# Patient Record
Sex: Female | Born: 1960 | Race: White | Hispanic: No | Marital: Single | Smoking: Current every day smoker
Health system: Southern US, Community
[De-identification: ages and names within clinical notes are randomized; demographics above are authoritative.]

## PROBLEM LIST (undated history)

## (undated) DIAGNOSIS — L409 Psoriasis, unspecified: Secondary | ICD-10-CM

## (undated) DIAGNOSIS — Z720 Tobacco use: Secondary | ICD-10-CM

## (undated) DIAGNOSIS — F419 Anxiety disorder, unspecified: Secondary | ICD-10-CM

## (undated) DIAGNOSIS — G5600 Carpal tunnel syndrome, unspecified upper limb: Secondary | ICD-10-CM

## (undated) DIAGNOSIS — T7840XA Allergy, unspecified, initial encounter: Secondary | ICD-10-CM

## (undated) DIAGNOSIS — A879 Viral meningitis, unspecified: Secondary | ICD-10-CM

## (undated) HISTORY — DX: Anxiety disorder, unspecified: F41.9

## (undated) HISTORY — DX: Allergy, unspecified, initial encounter: T78.40XA

## (undated) HISTORY — DX: Tobacco use: Z72.0

## (undated) HISTORY — PX: OTHER SURGICAL HISTORY: SHX169

## (undated) HISTORY — DX: Carpal tunnel syndrome, unspecified upper limb: G56.00

## (undated) HISTORY — DX: Psoriasis, unspecified: L40.9

## (undated) HISTORY — DX: Viral meningitis, unspecified: A87.9

---

## 1997-10-17 ENCOUNTER — Other Ambulatory Visit: Admission: RE | Admit: 1997-10-17 | Discharge: 1997-10-17 | Payer: Self-pay | Admitting: *Deleted

## 1997-10-19 ENCOUNTER — Inpatient Hospital Stay (HOSPITAL_COMMUNITY): Admission: EM | Admit: 1997-10-19 | Discharge: 1997-10-24 | Payer: Self-pay | Admitting: Emergency Medicine

## 1998-01-31 LAB — HM DEXA SCAN

## 1998-11-03 ENCOUNTER — Other Ambulatory Visit: Admission: RE | Admit: 1998-11-03 | Discharge: 1998-11-03 | Payer: Self-pay | Admitting: *Deleted

## 1999-11-25 ENCOUNTER — Other Ambulatory Visit: Admission: RE | Admit: 1999-11-25 | Discharge: 1999-11-25 | Payer: Self-pay | Admitting: *Deleted

## 2002-11-26 ENCOUNTER — Other Ambulatory Visit: Admission: RE | Admit: 2002-11-26 | Discharge: 2002-11-26 | Payer: Self-pay | Admitting: Family Medicine

## 2002-11-26 ENCOUNTER — Encounter: Payer: Self-pay | Admitting: Family Medicine

## 2002-11-26 LAB — CONVERTED CEMR LAB: Pap Smear: NORMAL

## 2004-03-01 ENCOUNTER — Ambulatory Visit: Payer: Self-pay | Admitting: Family Medicine

## 2004-10-07 ENCOUNTER — Ambulatory Visit: Payer: Self-pay | Admitting: Family Medicine

## 2004-12-01 HISTORY — PX: ABDOMINAL HYSTERECTOMY: SHX81

## 2004-12-01 HISTORY — PX: US TRANSVAGINAL PELVIC MODIFIED: HXRAD721

## 2004-12-27 ENCOUNTER — Observation Stay (HOSPITAL_COMMUNITY): Admission: RE | Admit: 2004-12-27 | Discharge: 2004-12-28 | Payer: Self-pay | Admitting: Gynecology

## 2004-12-27 ENCOUNTER — Encounter (INDEPENDENT_AMBULATORY_CARE_PROVIDER_SITE_OTHER): Payer: Self-pay | Admitting: *Deleted

## 2005-03-15 ENCOUNTER — Ambulatory Visit: Payer: Self-pay | Admitting: Family Medicine

## 2006-06-27 ENCOUNTER — Ambulatory Visit: Payer: Self-pay | Admitting: Family Medicine

## 2006-07-24 ENCOUNTER — Ambulatory Visit: Payer: Self-pay | Admitting: Gynecology

## 2006-07-31 ENCOUNTER — Encounter: Admission: RE | Admit: 2006-07-31 | Discharge: 2006-07-31 | Payer: Self-pay | Admitting: Gynecology

## 2006-08-17 ENCOUNTER — Telehealth (INDEPENDENT_AMBULATORY_CARE_PROVIDER_SITE_OTHER): Payer: Self-pay | Admitting: *Deleted

## 2006-08-22 ENCOUNTER — Ambulatory Visit: Payer: Self-pay | Admitting: Internal Medicine

## 2006-09-13 ENCOUNTER — Ambulatory Visit: Payer: Self-pay | Admitting: Family Medicine

## 2006-09-13 DIAGNOSIS — F17200 Nicotine dependence, unspecified, uncomplicated: Secondary | ICD-10-CM | POA: Insufficient documentation

## 2006-11-21 ENCOUNTER — Encounter: Payer: Self-pay | Admitting: Family Medicine

## 2006-11-21 DIAGNOSIS — J45909 Unspecified asthma, uncomplicated: Secondary | ICD-10-CM | POA: Insufficient documentation

## 2006-11-21 DIAGNOSIS — L408 Other psoriasis: Secondary | ICD-10-CM | POA: Insufficient documentation

## 2006-11-21 DIAGNOSIS — J309 Allergic rhinitis, unspecified: Secondary | ICD-10-CM | POA: Insufficient documentation

## 2006-11-21 DIAGNOSIS — F411 Generalized anxiety disorder: Secondary | ICD-10-CM | POA: Insufficient documentation

## 2006-11-22 ENCOUNTER — Ambulatory Visit: Payer: Self-pay | Admitting: Family Medicine

## 2006-11-22 DIAGNOSIS — G5600 Carpal tunnel syndrome, unspecified upper limb: Secondary | ICD-10-CM | POA: Insufficient documentation

## 2006-12-19 ENCOUNTER — Ambulatory Visit: Payer: Self-pay | Admitting: Family Medicine

## 2008-02-25 ENCOUNTER — Ambulatory Visit: Payer: Self-pay | Admitting: Internal Medicine

## 2008-05-21 ENCOUNTER — Telehealth: Payer: Self-pay | Admitting: Family Medicine

## 2008-06-30 ENCOUNTER — Observation Stay: Payer: Self-pay | Admitting: Otolaryngology

## 2008-07-16 ENCOUNTER — Ambulatory Visit: Payer: Self-pay | Admitting: Obstetrics and Gynecology

## 2009-05-31 HISTORY — PX: CYSTECTOMY: SUR359

## 2009-08-31 LAB — CONVERTED CEMR LAB: Pap Smear: NORMAL

## 2009-08-31 LAB — HM MAMMOGRAPHY: HM Mammogram: NORMAL

## 2009-09-03 ENCOUNTER — Telehealth: Payer: Self-pay | Admitting: Family Medicine

## 2009-09-03 ENCOUNTER — Ambulatory Visit: Payer: Self-pay | Admitting: Family Medicine

## 2009-09-03 ENCOUNTER — Encounter: Payer: Self-pay | Admitting: Family Medicine

## 2009-09-03 LAB — CONVERTED CEMR LAB
Bilirubin Urine: NEGATIVE
Blood in Urine, dipstick: NEGATIVE
Glucose, Urine, Semiquant: NEGATIVE
Ketones, urine, test strip: NEGATIVE
Nitrite: NEGATIVE
Protein, U semiquant: NEGATIVE
Specific Gravity, Urine: 1.015
Urobilinogen, UA: 0.2
WBC Urine, dipstick: NEGATIVE
pH: 6

## 2009-09-04 ENCOUNTER — Ambulatory Visit (HOSPITAL_COMMUNITY): Admission: RE | Admit: 2009-09-04 | Discharge: 2009-09-04 | Payer: Self-pay | Admitting: Obstetrics and Gynecology

## 2009-09-07 ENCOUNTER — Ambulatory Visit: Payer: Self-pay | Admitting: Obstetrics and Gynecology

## 2009-09-08 ENCOUNTER — Encounter: Payer: Self-pay | Admitting: Family Medicine

## 2009-09-08 LAB — CONVERTED CEMR LAB
Trich, Wet Prep: NONE SEEN
Yeast Wet Prep HPF POC: NONE SEEN

## 2009-09-14 ENCOUNTER — Encounter: Admission: RE | Admit: 2009-09-14 | Discharge: 2009-09-14 | Payer: Self-pay | Admitting: Obstetrics and Gynecology

## 2009-10-29 ENCOUNTER — Telehealth (INDEPENDENT_AMBULATORY_CARE_PROVIDER_SITE_OTHER): Payer: Self-pay | Admitting: *Deleted

## 2009-11-02 ENCOUNTER — Ambulatory Visit: Payer: Self-pay | Admitting: Family Medicine

## 2009-11-02 LAB — CONVERTED CEMR LAB
ALT: 35 units/L (ref 0–35)
AST: 33 units/L (ref 0–37)
Albumin: 4.2 g/dL (ref 3.5–5.2)
Alkaline Phosphatase: 64 units/L (ref 39–117)
BUN: 9 mg/dL (ref 6–23)
Basophils Absolute: 0 10*3/uL (ref 0.0–0.1)
Basophils Relative: 0.6 % (ref 0.0–3.0)
Bilirubin, Direct: 0.1 mg/dL (ref 0.0–0.3)
CO2: 25 meq/L (ref 19–32)
Calcium: 8.9 mg/dL (ref 8.4–10.5)
Chloride: 104 meq/L (ref 96–112)
Cholesterol: 191 mg/dL (ref 0–200)
Creatinine, Ser: 0.7 mg/dL (ref 0.4–1.2)
Eosinophils Absolute: 0.1 10*3/uL (ref 0.0–0.7)
Eosinophils Relative: 1.6 % (ref 0.0–5.0)
GFR calc non Af Amer: 96.68 mL/min (ref >60)
Glucose, Bld: 94 mg/dL (ref 70–99)
HCT: 37.5 % (ref 36.0–46.0)
HDL: 48.4 mg/dL (ref >39.00)
Hemoglobin: 13.2 g/dL (ref 12.0–15.0)
LDL Cholesterol: 129 mg/dL — ABNORMAL HIGH (ref 0–99)
Lymphocytes Relative: 23.9 % (ref 12.0–46.0)
Lymphs Abs: 1.6 10*3/uL (ref 0.7–4.0)
MCHC: 35.1 g/dL (ref 30.0–36.0)
MCV: 94.7 fL (ref 78.0–100.0)
Monocytes Absolute: 0.6 10*3/uL (ref 0.1–1.0)
Monocytes Relative: 8.9 % (ref 3.0–12.0)
Neutro Abs: 4.4 10*3/uL (ref 1.4–7.7)
Neutrophils Relative %: 65 % (ref 43.0–77.0)
Platelets: 246 10*3/uL (ref 150.0–400.0)
Potassium: 4.1 meq/L (ref 3.5–5.1)
RBC: 3.96 M/uL (ref 3.87–5.11)
RDW: 13.2 % (ref 11.5–14.6)
Sodium: 138 meq/L (ref 135–145)
TSH: 1.63 microintl units/mL (ref 0.35–5.50)
Total Bilirubin: 0.5 mg/dL (ref 0.3–1.2)
Total CHOL/HDL Ratio: 4
Total Protein: 6.5 g/dL (ref 6.0–8.3)
Triglycerides: 70 mg/dL (ref 0.0–149.0)
VLDL: 14 mg/dL (ref 0.0–40.0)
WBC: 6.8 10*3/uL (ref 4.5–10.5)

## 2009-11-06 ENCOUNTER — Ambulatory Visit: Payer: Self-pay | Admitting: Family Medicine

## 2009-11-06 DIAGNOSIS — M25519 Pain in unspecified shoulder: Secondary | ICD-10-CM | POA: Insufficient documentation

## 2010-03-02 NOTE — Progress Notes (Signed)
Summary: CT Call Report  Phone Note From Other Clinic   Caller: [facility] - CT Call For: Dr. Para March Request: Call Patient Summary of Call: Call Report:   No acute appendicitis.  No evidence of bowel obstruction or ileus.  No findings of acute diverticulitis.  No hepatobiliary abnormality or UTI abnormality.  Likely cystic ovarian processes.   Call taken from [facility].  EMR went down before note could be originated.    Faxed report received.  Dr. Para March stated on the hard copy of the CT report to send in Tramadol 50 mg. by mouth every 6 hours as needed for pain  #30/1RF.  If pain continues, have patient call back to set up GYN U/S. Patient Advised.  Medication [facility] [place].  Patient states she has a GYN appt. on the 15th of August.  Delilah Shan CMA Duncan Dull)  September 03, 2009 3:35 PM   Follow-up for Phone Call        Thanks.  Please CC the CT result to gyn clinic.  She can follow up with them.   Follow-up by: Crawford Givens MD,  September 03, 2009 4:15 PM  Additional Follow-up for Phone Call Additional follow up Details #1::        Faxed a copy of the CT to OB/GYN [facility].  Lugene Fuquay CMA Duncan Dull)  September 03, 2009 4:47 PM

## 2010-03-02 NOTE — Assessment & Plan Note (Signed)
Summary: bad stomach pain/rbh   Vital Signs:  Patient profile:   50 year old female Height:      62 inches Weight:      126.25 pounds BMI:     23.17 Temp:     98.1 degrees F oral Pulse rate:   92 / minute Pulse rhythm:   regular BP sitting:   112 / 70  (left arm) Cuff size:   regular  Vitals Entered By: Delilah Shan CMA Duncan Dull) (September 03, 2009 10:36 AM) CC: Bad stomach pain since Monday, has been mostly at night until today.   History of Present Illness: Abd pain.  Started on Monday night.  Usually at night, but having pain now.  R and L lower quadrants initially, now more in RLQ.  "Like cramps but I've had a hysterectomy."  No vomiting but some nausea today.  No fevers.  Occ sweats this AM.  No BRBPR, some dysuria this AM (none before).  No vaginal discharge.  No cough.  Better laying down.  Dec in appetite.   Allergies: 1)  ! Sudafed (Pseudoephedrine Hcl) 2)  Sudafed   Impression & Recommendations:  Problem # 1:  ABDOMINAL PAIN RIGHT LOWER QUADRANT (ICD-789.03)  Orders: UA Dipstick w/o Micro (manual) (62694) Radiology Referral (Radiology)  Complete Medication List: 1)  Fexofenadine Hcl 180 Mg Tabs (Fexofenadine hcl) .Marland Kitchen.. 1 daily as needed for allergies 2)  Tramadol Hcl 50 Mg Tabs (Tramadol hcl) .... Take 1 tablet by mouth every 6 hours as needed for pain  Patient Instructions: 1)  See Shirlee Limerick about your referral before your leave today.   They should call me with the report of the scan.   Prescriptions: TRAMADOL HCL 50 MG TABS (TRAMADOL HCL) Take 1 tablet by mouth every 6 hours as needed for pain  #30 x 1   Entered by:   Delilah Shan CMA (AAMA)   Authorized by:   Crawford Givens MD   Signed by:   Delilah Shan CMA (AAMA) on 09/03/2009   Method used:   [facility]  [place] [id number]       [address]       [place], [place]  [address]       Ph: [phone] or [phone]       Fax: [phone]   RxID:    [id number]   Current Allergies (reviewed today): ! SUDAFED (PSEUDOEPHEDRINE HCL) SUDAFED  Laboratory Results   Urine Tests  Date/Time Received: September 03, 2009 11:19 AM   Routine Urinalysis   Color: yellow Appearance: Clear Glucose: negative   (Normal Range: Negative) Bilirubin: negative   (Normal Range: Negative) Ketone: negative   (Normal Range: Negative) Spec. Gravity: 1.015   (Normal Range: 1.003-1.035) Blood: negative   (Normal Range: Negative) pH: 6.0   (Normal Range: 5.0-8.0) Protein: negative   (Normal Range: Negative) Urobilinogen: 0.2   (Normal Range: 0-1) Nitrite: negative   (Normal Range: Negative) Leukocyte Esterace: negative   (Normal Range: Negative)       Appended Document: bad stomach pain/rbh    Clinical Lists Changes  Problems: Assessed ABDOMINAL PAIN RIGHT LOWER QUADRANT as comment only - D/w patient.  Pain migrated to RLQ.  Will send for CT to eval for appendicitis.  **Addendum.  Call report.  no appendicitis on scan.  Likely ovarian cysts.  Will have patient follow up with gyn.  See following notes. Observations: Added new observation of PEADULT: Crawford Givens MD ~General`Gen  appear (09/03/2009 22:07) Added new observation of GEN APPEAR: GEN: nad, alert and oriented, uncomfortable.  HEENT: mucous membranes moist NECK: supple w/o LA CV: rrr. PULM: ctab, no inc wob ABD: soft, +bs, RLQ tender to palpation but w/o rebound.  no cva pain EXT: no edema SKIN: no acute rash  (09/03/2009 22:07) Added new observation of ROS: See HPI.  Otherwise negative.   (09/03/2009 22:07)       Past History:  Past Medical History: Last updated: 11/21/2006 Allergic rhinitis Anxiety Asthma  Past Surgical History: Last updated: 11/21/2006 Viral meningitis ? Dexa- osteoporosis (2000) Arm fracture- bilateral Pelvic US- fibroids (12/2004) Hysterectomy- total (12/2004)   Review of Systems       See HPI.  Otherwise negative.     Physical  Exam  General:  GEN: nad, alert and oriented, uncomfortable.  HEENT: mucous membranes moist NECK: supple w/o LA CV: rrr. PULM: ctab, no inc wob ABD: soft, +bs, RLQ tender to palpation but w/o rebound.  no cva pain EXT: no edema SKIN: no acute rash    Impression & Recommendations:  Problem # 1:  ABDOMINAL PAIN RIGHT LOWER QUADRANT (ICD-789.03) D/w patient.  Pain migrated to RLQ.  Will send for CT to eval for appendicitis.  **Addendum.  Call report.  no appendicitis on scan.  Likely ovarian cysts.  Will have patient follow up with gyn.  See following notes.   Complete Medication List: 1)  Fexofenadine Hcl 180 Mg Tabs (Fexofenadine hcl) .Marland Kitchen.. 1 daily as needed for allergies 2)  Tramadol Hcl 50 Mg Tabs (Tramadol hcl) .... Take 1 tablet by mouth every 6 hours as needed for pain

## 2010-03-02 NOTE — Assessment & Plan Note (Signed)
Summary: cpx/alc   Vital Signs:  Patient profile:   50 year old female Height:      62 inches Weight:      129.25 pounds BMI:     23.73 Temp:     98.3 degrees F oral Pulse rate:   84 / minute Pulse rhythm:   regular BP sitting:   128 / 80  (left arm) Cuff size:   regular  Vitals Entered By: Lewanda Rife LPN (November 06, 2009 2:39 PM) CC: CPX GYN does pap and breast exam   History of Present Illness: here for health mt exam and to rev chronic med problems  mother passed away in may  copd and heart problems difficult transition -- was her caregiver  depression hits her at times family is good support  she does not want any counseling   fiancee is good support too   is more achey all over -- possibly from stress  started having some night sweats too    wt is up 3 lb with good bmi  128/80 blood pressure - good  partial t hyst in 06 pap per gyn -- in august  (also had a CT scan with low abd pain) - and was dx with a vaginal infection and she got antibiotic - that is better  mammogram also in aug - all was good   a year ago this past may had surgery with Dr Nelda Marseille (mass on roof of her mouth)   lipids this draw fair with trig 79 and HDL 48 and LDL 129  Td04 pneumovax 10/04 flu shot -- wants to do today   smoking status- still smokes much to stressed to think about quitting any time soon-- loss of family members has not helped much  L shoulder and arm hurts -- shoulder to elbow -- worse when she raises arm or lifts with that arm  has taken ibuprofen    wants to try something daily for anxiety   Allergies: 1)  ! Sudafed (Pseudoephedrine Hcl) 2)  Sudafed  Past History:  Family History: Last updated: 11/06/2009 Father: Lung cancer- smoker, deceased Mother: passed in 07/02/09 of copd  Siblings:   Social History: Last updated: 11/21/2006 Current Smoker Marital Status: Married Children: 1 daughter Occupation: Geologist, engineering  Risk Factors: Smoking Status:  current (08/22/2006)  Past Medical History: Allergic rhinitis Anxiety Asthma OP psoriasis carpal tunnel tabacco abuse  sees Dr Nelda Marseille Q 6 months for check of mouth after surgery     ENT Dr Nelda Marseille  gyn- Dr Okey Dupre  gyn -   Past Surgical History: Viral meningitis ? Dexa- osteoporosis (2000) Arm fracture- bilateral Pelvic US- fibroids (12/2004) Hysterectomy- partial  (12/2004) 2009-07-02-- had cyst removed from roof of mouth  Family History: Father: Lung cancer- smoker, deceased Mother: passed in 2009-07-02 of copd  Siblings:   Review of Systems General:  Complains of fatigue and loss of appetite; denies chills and fever. Eyes:  Denies blurring and eye irritation. CV:  Denies chest pain or discomfort, lightheadness, and palpitations. Resp:  Denies cough and wheezing. GI:  Denies abdominal pain, change in bowel habits, indigestion, nausea, and vomiting. GU:  Denies abnormal vaginal bleeding, discharge, dysuria, and urinary frequency. MS:  Complains of joint pain, muscle aches, and stiffness; denies joint redness, joint swelling, and cramps. Derm:  Denies itching, lesion(s), poor wound healing, and rash. Neuro:  Denies numbness and tingling. Psych:  Complains of anxiety, depression, and irritability; denies panic attacks, sense of great danger,  and suicidal thoughts/plans. Endo:  Denies cold intolerance, excessive thirst, excessive urination, and heat intolerance. Heme:  Denies abnormal bruising and bleeding.  Physical Exam  General:  Well-developed,well-nourished,in no acute distress; alert,appropriate and cooperative throughout examination seems very stressed and tired  Head:  normocephalic, atraumatic, and no abnormalities observed.   Eyes:  vision grossly intact, pupils equal, pupils round, and pupils reactive to light.  no conjunctival pallor, injection or icterus  Ears:  R ear normal and L ear normal.   Nose:  no nasal discharge.  - nares are boggy Mouth:  pharynx pink and  moist.   Neck:  supple with full rom and no masses or thyromegally, no JVD or carotid bruit  Chest Wall:  No deformities, masses, or tenderness noted. Lungs:  diffusely distant bs no rales or wheezes  nl exp phase Heart:  Normal rate and regular rhythm. S1 and S2 normal without gallop, murmur, click, rub or other extra sounds. Abdomen:  Bowel sounds positive,abdomen soft and non-tender without masses, organomegaly or hernias noted. no renal bruits  Msk:  No deformity or scoliosis noted of thoracic or lumbar spine.  poor rom L shoulder with some acromion tenderness Pulses:  R and L carotid,radial,femoral,dorsalis pedis and posterior tibial pulses are full and equal bilaterally Extremities:  No clubbing, cyanosis, edema, or deformity noted with normal full range of motion of all joints.   Neurologic:  sensation intact to light touch, gait normal, and DTRs symmetrical and normal.   Skin:  Intact without suspicious lesions or rashes Cervical Nodes:  No lymphadenopathy noted Inguinal Nodes:  No significant adenopathy Psych:  sad and tearful at times at others voices anger seems anxious and figity overall  fair eye contact    Impression & Recommendations:  Problem # 1:  HEALTH MAINTENANCE EXAM (ICD-V70.0) Assessment Comment Only reviewed health habits including diet, exercise and skin cancer prevention reviewed health maintenance list and family history  flu and pneumovax today  Problem # 2:  TOBACCO ABUSE (ICD-305.1) discussed in detail risks of smoking, and possible outcomes including COPD, vascular dz, cancer and also respiratory infections/sinus problems  pt states she is not ready or interested in quitting now - but voiced understanding of above   Problem # 3:  ANXIETY (ICD-300.00) Assessment: New with grief rxn  spent 25 minutes face to face time with pt , over 50% of which was spent on counseling and coordination of care  pt has unresolved anger and other family issues that need  to be resolved offered counseing/ declined trial of buspar with warnings  update  Her updated medication list for this problem includes:    Xanax 0.25 Mg Tabs (Alprazolam) .Marland Kitchen... As needed.    Buspirone Hcl 15 Mg Tabs (Buspirone hcl) .Marland Kitchen... 1/2 by mouth two times a day for anxiety  Problem # 4:  ALLERGIC RHINITIS (ICD-477.9) Assessment: Deteriorated allegra no longer works will try claritin or zyrtec otc and update The following medications were removed from the medication list:    Fexofenadine Hcl 180 Mg Tabs (Fexofenadine hcl) .Marland Kitchen... 1 daily as needed for allergies  Problem # 5:  SHOULDER PAIN, LEFT (ICD-719.41) Assessment: New L shoulder pain with features of tendonitis  will try ibuprofen as needed and update  consider appt with Dr Patsy Lager  Her updated medication list for this problem includes:    Tramadol Hcl 50 Mg Tabs (Tramadol hcl) .Marland Kitchen... Take 1 tablet by mouth every 6 hours as needed for pain    Ibuprofen 200 Mg Tabs (  Ibuprofen) ..... Otc as directed.  Complete Medication List: 1)  Tramadol Hcl 50 Mg Tabs (Tramadol hcl) .... Take 1 tablet by mouth every 6 hours as needed for pain 2)  Ibuprofen 200 Mg Tabs (Ibuprofen) .... Otc as directed. 3)  Xanax 0.25 Mg Tabs (Alprazolam) .... As needed. 4)  Buspirone Hcl 15 Mg Tabs (Buspirone hcl) .... 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, commercial) [id number]) Admin 1st Vaccine (34742) Flu Vaccine 69yrs + [phone])  Patient Instructions: 1)  you can raise your HDL (good cholesterol) by increasing exercise and eating omega 3 fatty acid supplement like fish oil or flax seed oil over the counter 2)  you can lower LDL (bad cholesterol) by limiting saturated fats in diet like red meat, fried foods, egg yolks, fatty breakfast meats, high fat dairy products and shellfish 3)  take ibuprofen for 2-4 weeks- if shoulder does not improve- call for appt with Dr  Patsy Lager  4)  stop the allegra and try plain claritin or zyrtec  5)  try the buspar for anxiety 6)  let me know if you are interested in counseling in the future  Prescriptions: BUSPIRONE HCL 15 MG TABS (BUSPIRONE HCL) 1/2 by mouth two times a day for anxiety  #30 x 11   Entered and Authorized by:   Judith Part MD   Signed by:   Judith Part MD on 11/06/2009   Method used:   [facility]  [place] [id number]       [address]       [place], [place]  [address]       Ph: [phone] or [phone]       Fax: [phone]   RxID:   [id number]   Current Allergies (reviewed today): ! SUDAFED (PSEUDOEPHEDRINE HCL) SUDAFED   Preventive Care Screening  Mammogram:    Date:  08/31/2009    Results:  normal   Pap Smear:    Date:  08/31/2009    Results:  normal     Pneumovax Vaccine    Vaccine Type: Pneumovax    Site: right deltoid    Mfr: Merck    Dose: 0.5 ml    Route: IM    Given by: Lewanda Rife LPN    Exp. Date: 04/15/2011    Lot #: [id number]    VIS given: 01/05/09 version given November 06, 2009.          Flu Vaccine Consent Questions     Do you have a history of severe allergic reactions to this vaccine? no    Any prior history of allergic reactions to egg and/or gelatin? no    Do you have a sensitivity to the preservative Thimersol? no    Do you have a past history of Guillan-Barre Syndrome? no    Do you currently have an acute febrile illness? no    Have you ever had a severe reaction to latex? no    Vaccine information given and explained to patient? yes    Are you currently pregnant? no    Lot Number:[id number]   Exp Date:07/31/2010   Site Given  Left Deltoid IMflu Lewanda Rife LPN  November 06, 2009 3:53 PM

## 2010-03-02 NOTE — Miscellaneous (Signed)
Summary: Controlled Substance Agreement  Controlled Substance Agreement   Imported By: Lanelle Bal 11/13/2009 14:16:54  _____________________________________________________________________  External Attachment:    Type:   Image     Comment:   External Document

## 2010-03-02 NOTE — Progress Notes (Signed)
----   Converted from flag ---- ---- 10/29/2009 2:54 PM, Judith Part MD wrote: please check wellness and lipid v70.0 thanks  ---- 10/29/2009 10:05 AM, Liane Comber CMA (AAMA) wrote: Lab orders please! Good Morning! This pt is scheduled for cpx labs Monday, which labs to draw and dx codes to use? Thanks Tasha ------------------------------

## 2010-06-15 NOTE — Assessment & Plan Note (Signed)
NAMEGLENNDA, Brewer                 ACCOUNT NO.:  [id number]   MEDICAL RECORD NO.:  [id number]          PATIENT TYPE:  POB   LOCATION:  [facility] at [facility]         FACILITY:  [facility]   PHYSICIAN:  Argentina Donovan, MD        DATE OF BIRTH:  07/04/1960   DATE OF SERVICE:  07/16/2008                                  CLINIC NOTE   The patient is a 50 year old Caucasian female who underwent total  vaginal hysterectomy in 2006 for benign disease, is here today for her  annual exam.  She has no significant complaints.  She is returning to  her ENT today as the biopsy of the roof of her mouth was done several  weeks ago for a mass there.  She is on Xanax rarely but p.r.n. for  anxiety, takes Allegra for seasonal allergies.   Significant change since she was last seen in the family history is her  sister had breast cancer and bilateral mastectomies.  No other family  history of breast cancer.   She is gravida 1, para 1-0-0-1, has one daughter age 33.  She has no  complaints at the present time.  We are going to schedule her for a  mammogram.  She has not had one in over 2 years.   PHYSICAL EXAMINATION:  Her blood pressure was 109/66, her pulse was 78.  Her height was 5 feet 1 inch and her weight 119 pounds.  She is a well-  developed, well-nourished white female no acute distress.  HEENT:  Within normal limits.  The thyroid is symmetrical with no  masses.  NECK:  Supple.  Back is erect.  LUNGS:  Clear to auscultation and percussion.  HEART:  No murmur, normal sinus rhythm.  BREASTS:  Symmetrical, no dominant masses.  No supraclavicular or  axillary nodes.  ABDOMEN:  Soft, flat, nontender.  No masses or organomegaly.  External  genitalia is normal.  BUS is within normal limits.  Vagina is clean,  well rugated, status hysterectomy.  Bimanual:  The adnexa is easily  palpable and within normal limits.  The cul-de-sac is free.  RECTAL EXAM:  No masses.   IMPRESSION:  Normal physical  examination.           ______________________________  Argentina Donovan, MD     PR/MEDQ  D:  07/16/2008  T:  07/16/2008  Job:  [id number]

## 2010-06-15 NOTE — Assessment & Plan Note (Signed)
NAMESHANDRIKA, AMBERS NO.:  [id number]   MEDICAL RECORD NO.:  [id number]          PATIENT TYPE:  POB   LOCATION:  [facility] at [facility]         FACILITY:  [facility]   PHYSICIAN:  Argentina Donovan, MD        DATE OF BIRTH:  [birth year]   DATE OF SERVICE:  09/07/2009                                  CLINIC NOTE   The patient is a 50 year old Caucasian female who underwent vaginal  hysterectomy in 2006 for benign disease in for annual examination.  She  still has her ovaries and within the last week as she was getting ready  for bed, she had sharp, stabbing pain that lasted about 10 minutes  across the lower portion of her abdomen and the following night the same  thing occurred and the following morning the pain occurred and it did  not go away, so she went to her family practice doctor who examined her  where she was very tender in the lower abdomen and sent her over for a  CAT scan.  They thought they might saw some abnormalities around the  ovaries, so they got an ultrasound which showed a normal ovaries and no  pathology seen in the pelvis.  There was no sign of any bowel pathology  on a CT scan or any other abnormality that could be seen.  The patient  denies any urinary frequency or nocturia.  The appendix was normal.  The  urine was normal at that time, although I am going to get a urine  culture.  On examination, we did a physical examination.  The abdominal  exam was benign as was the bimanual exam, so I am not sure what is  causing this.   PHYSICAL EXAMINATION:  VITAL SIGNS:  Her blood pressure today was  111/69, pulse 66, she weighs 127 pounds and 5 feet tall.  GENERAL:  Well-developed, well-nourished white female in no acute  distress.  HEENT:  Normal.  Thyroid is symmetrical.  No dominant masses.  NECK:  Supple.  BACK:  Erect.  LUNGS:  Clear to auscultation and percussion.  HEART:  No murmur.  Normal sinus rhythm.  BREASTS:  Symmetrical.  No dominant masses.   No supraclavicular or  axillary nodes.  ABDOMEN:  Soft, flat, nontender.  No masses.  No organomegaly.  External  genitalia is normal.  BUS within normal limits.  Vagina is clean, well  rugated with a cuff status hysterectomy.  Bimanual examination, I think  there is an ovary at the apex of the vagina.  However, the other ovary  could not be palpated and the cul-de-sac was free.  RECTAL:  Confirmatory.   Normal physical examination with pain of unknown etiology that is  intermittent and very sharp.  The only thing other than that what I  mentioned is that the patient's sister, 80 months old, has had bilateral  mastectomies.  This patient has always had a normal breast exam and is  scheduled for a mammogram in the near future.   IMPRESSION:  Normal examination, lower abdominal pain of unknown  etiology.          ______________________________  Argentina Donovan, MD    PR/MEDQ  D:  09/07/2009  T:  09/08/2009  Job:  [id number]

## 2010-06-18 NOTE — Discharge Summary (Signed)
[name], [name]                 ACCOUNT NO.:  [id number]   MEDICAL RECORD NO.:  [id number]          PATIENT TYPE:  OBV   LOCATION:  9319                          FACILITY:  [facility]   PHYSICIAN:  Ginger Carne, MD  DATE OF BIRTH:  02-04-1960   DATE OF ADMISSION:  12/27/2004  DATE OF DISCHARGE:  12/28/2004                                 DISCHARGE SUMMARY   REASON FOR ADMISSION:  Persistent vaginal bleeding, menometrorrhagia.   IN-HOSPITAL PROCEDURES:  Total vaginal hysterectomy.   FINAL DIAGNOSIS:  Total vaginal hysterectomy.   HISTORY OF PRESENT ILLNESS:  This patient is a 49 year old Caucasian female  who underwent the aforementioned procedure on December 27, 2004. The patient  had appropriate workup for menometrorrhagia prior to her surgery. The  patient's intraoperative course was uneventful. Postoperatively she was  afebrile, voided well and she had no vaginal bleeding. Calves without  tenderness, lungs were clear. H&H was 33.4 and 11.7. The patient was  discharged routine postoperative instructions including contacting the  office for temperature elevation above 100.4 degrees Fahrenheit, increasing  abdominal pain, increased vaginal bleeding, genitourinary or  gastrointestinal complaints. She was prescribed Percocet 5/325 1-2 every 4-6  hours, return to the office in the first week of January for postoperative  care. The patient plans to return to work within the next 7-10 days. All  questions answered to satisfaction of said patient and also instructions  understood.      Ginger Carne, MD  Electronically Signed     SHB/MEDQ  D:  12/28/2004  T:  12/28/2004  Job:  [id number]

## 2010-06-18 NOTE — Op Note (Signed)
NAMEETHELREDA, SUKHU                 ACCOUNT NO.:  [id number]   MEDICAL RECORD NO.:  [id number]          PATIENT TYPE:  OBV   LOCATION:  9319                          FACILITY:  [facility]   PHYSICIAN:  Ginger Carne, MD  DATE OF BIRTH:  [birth year]   DATE OF PROCEDURE:  12/27/2004  DATE OF DISCHARGE:                                 OPERATIVE REPORT   PREOPERATIVE DIAGNOSIS:  Abnormal uterine bleeding.   POSTOPERATIVE DIAGNOSIS:  Abnormal uterine bleeding.   PROCEDURE:  Total vaginal hysterectomy with preservation of right and left  tubes and ovaries.   SURGEON:  Ginger Carne, M.D.   ASSISTANT:  None.   COMPLICATIONS:  None immediate.   ESTIMATED BLOOD LOSS:  Less than 75 mL.   SPECIMEN:  Uterus and cervix.   ANESTHESIA:  MAC with paracervical Marcaine block.   OPERATIVE FINDINGS:  External genitalia, vulva, and vagina normal. Cervix  smooth without erosions or lesions. The uterus was about 6 weeks in size,  globular, but otherwise unremarkable. Both tubes and ovaries were normal.   OPERATIVE PROCEDURE:  The patient was prepped and draped in usual fashion,  placed in the lithotomy position and Betadine solution used for antiseptic  and the patient catheterized prior to the procedure. After adequate MAC  analgesia double toothed tenaculum placed on the anterior and posterior lips  of the cervix. Paracervical Marcaine with epinephrine block utilized. 2 cm  of anterior and posterior vaginal epithelium incised transversely. The  peritoneal reflections opened anteriorly and posteriorly without injury to  the respective organs. Uterosacral cardinal ligament complexes clamped, cut  and ligated with 0 Vicryl suture. This extended to the uterine vasculature  with its ascending branches. The utero-ovarian ligaments were bilaterally  clamped, cut and ligated with 0 Vicryl suture, transfixation method twice.  Bleeding points hemostatically checked. Blood clots removed. Closure of the  cuff in one layer of 0 Vicryl running interlocking suture. The patient  tolerated procedure well, returned to post anesthesia recovery room in  excellent condition.      Ginger Carne, MD  Electronically Signed    SHB/MEDQ  D:  12/27/2004  T:  12/27/2004  Job:  [id number]

## 2010-06-18 NOTE — H&P (Signed)
[name], [name]                 ACCOUNT NO.:  [id number]   MEDICAL RECORD NO.:  [id number]           PATIENT TYPE:   LOCATION:                                 FACILITY:   PHYSICIAN:  Ginger Carne, MD  DATE OF BIRTH:  06-30-1960   DATE OF ADMISSION:  DATE OF DISCHARGE:                                HISTORY & PHYSICAL   REASON FOR HOSPITALIZATION:  Persistent vaginal bleeding and leiomyomatous  uterus.   PROPOSED SURGICAL PROCEDURE:  Total vaginal hysterectomy.   HISTORY OF PRESENT ILLNESS:  This patient is a 51 year old gravida 1, para 1-  0-0-1, Caucasian female referred by Dr. Roxy Manns because of persistent  vaginal bleeding.  The patient had a Depo-Provera injection at the beginning  of September of 2006 which has resulted in persistent irregular spotting and  heavy bleeding.  Prior to this time she had not used any form of  contraception.  About four years ago she had a 2-year course of Depo-Provera  which resulted in amenorrhea.  She had stopped using said medication in the  following 2-year period.  The patient has no endocrinological sources for  her bleeding.  She takes no medications to enhance her bleeding propensity  and has no personal family history of bleeding diaphyses.  Endometrial  biopsy reveals decidualized endometrium.   Pelvic sonogram demonstrated a normal sized uterus with two leiomyoma in the  myometrium measuring under 2.5 cm.  Both ovaries were reported to be normal.  SIS demonstrates no intracavitary lesions.   OB-GYN HISTORY:  The patient in 1989 had a vaginal delivery, full term.   MEDICATIONS:  Allegra as needed, Flonase.   ALLERGIES:  SUDAFED.   PAST MEDICAL HISTORY:  Seasonal allergies.   PAST SURGICAL HISTORY:  None.   SOCIAL HISTORY:  The patient smokes two packs of cigarettes daily.  She  denies alcohol or drug abuse.   FAMILY HISTORY:  First degree relatives with myocardial infarction, strokes,  and hypertension.   REVIEW  OF SYSTEMS:  Negative.   PHYSICAL EXAMINATION:  VITAL SIGNS:  Blood pressure is 115/75, weight 111  pounds, height 5 feet 0 inches.  HEENT:  Grossly normal.  BREASTS:  Without masses, discharge, thickenings, or tenderness.  CHEST:  Clear to percussion and auscultation.  CARDIOVASCULAR:  Without murmurs or enlargements.  Regular rate and rhythm.  EXTREMITIES/LYMPHATICS/SKIN/NEUROLOGIC/  MUSCULOSKELETAL:  Systems normal.  ABDOMEN:  Soft without gross hepatosplenomegaly.  PELVIC:  Normal Pap smear.  External genitalia, vulva, and vagina normal.  Cervix smooth without erosions or lesions.  Uterus is eight weeks in size,  globular.  Both adnexa palpable, thought to be normal.  RECTAL:  Exam is Hemoccult negative without mass.   LABORATORY DATA:  Endometrial biopsy is negative for neoplasia or  hyperplasia.  The patient's hemoglobin was 14.7, hematocrit 42.1, from  December 15, 2004.   IMPRESSION:  1.  Persistent vaginal bleeding.  2.  Leiomyomatous uterus.  3.  Hormonal effects of Depo-Provera.   PLAN:  The patient is over 35 and a two pack a day smoker and is  contraindicated to utilize estrogen for control of bleeding.  This in  conjunction with progesterone, would effectively be birth control pills  which would raise her risk of hemorrhagic stroke.  The patient options  included to allow the medication to run its course with abatement of  bleeding.  Second was an endometrial ablation with laparoscopic bilateral  tubal ligation, and the third option was to perform a simple total vaginal  hysterectomy with preservation of both tubes and ovaries.  The patient is  desirous of birth control and after weighing all aforementioned options, has  agreed to a total vaginal hysterectomy,  The nature of said procedure was  discussed in detail.  Risks including injuries to ureter, bowel, and  bladder, possible conversion to a laparoscopic or open procedure, hemorrhage  requiring a blood  transfusion, infection, and postoperative pulmonary  complications discussed with the patient.  She was advised to try and cut  back on smoking.      Ginger Carne, MD  Electronically Signed     SHB/MEDQ  D:  12/20/2004  T:  12/20/2004  Job:  [id number]

## 2010-09-27 ENCOUNTER — Encounter: Payer: Self-pay | Admitting: Family Medicine

## 2010-09-27 ENCOUNTER — Ambulatory Visit (INDEPENDENT_AMBULATORY_CARE_PROVIDER_SITE_OTHER): Payer: BC Managed Care – PPO | Admitting: Family Medicine

## 2010-09-27 VITALS — BP 122/72 | HR 74 | Temp 98.2°F | Wt 135.0 lb

## 2010-09-27 DIAGNOSIS — M79609 Pain in unspecified limb: Secondary | ICD-10-CM

## 2010-09-27 DIAGNOSIS — M79673 Pain in unspecified foot: Secondary | ICD-10-CM

## 2010-09-27 NOTE — Progress Notes (Signed)
Friday evening "I could barely walk."  Going on for 2 months.  Pain in R foot.  Posterior pain, near the achilles.  Psoriasis on the foot, h/o. No trauma. Occ midfoot pain.  Pain on the bottom of heel.  It hurts worse later in the day than in the AM.   Usually wears flip flops.  Using ankle sleeve with minimal help.    Meds, vitals, and allergies reviewed.   ROS: See HPI.  Otherwise, noncontributory.  nad R ankle with normal inspection, not puffy, no bruising Distally nv intact Psoriatic changes noted Loss of arches in R foot noted ttp R medial ankle, inferior to med mal Achilles not ttp Plantar aspect not ttp Not ttp on 5th MT

## 2010-09-27 NOTE — Patient Instructions (Signed)
Take the ibuprofen with food, get soft inserts with arch support for your tennis shoes, and this gradually get better.  If you aren't getting better, Dr. Patsy Lager may be able to help.

## 2010-09-28 NOTE — Assessment & Plan Note (Signed)
Likely related to arch breakdown with change in pressure distribution/compensation.  D/w pt about wearing shoes, not flip flops, using inserts with arch support and the f/u prn.  She agrees. Ibuprofen with NSAID caution, then call back for eval with Dr. Patsy Lager if not improved.

## 2010-11-18 ENCOUNTER — Encounter: Payer: Self-pay | Admitting: Family Medicine

## 2010-11-19 ENCOUNTER — Ambulatory Visit (INDEPENDENT_AMBULATORY_CARE_PROVIDER_SITE_OTHER)
Admission: RE | Admit: 2010-11-19 | Discharge: 2010-11-19 | Disposition: A | Discharge status: Home / Self Care | Payer: BC Managed Care – PPO | Source: Ambulatory Visit | Attending: Family Medicine | Admitting: Family Medicine

## 2010-11-19 ENCOUNTER — Ambulatory Visit
Admission: RE | Admit: 2010-11-19 | Discharge: 2010-11-19 | Disposition: A | Discharge status: Home / Self Care | Payer: BC Managed Care – PPO | Source: Ambulatory Visit | Attending: Family Medicine | Admitting: Family Medicine

## 2010-11-19 ENCOUNTER — Ambulatory Visit (INDEPENDENT_AMBULATORY_CARE_PROVIDER_SITE_OTHER): Payer: BC Managed Care – PPO | Admitting: Family Medicine

## 2010-11-19 ENCOUNTER — Encounter: Payer: Self-pay | Admitting: Family Medicine

## 2010-11-19 VITALS — BP 112/66 | HR 80 | Temp 97.9°F | Ht 62.0 in | Wt 135.8 lb

## 2010-11-19 DIAGNOSIS — M79673 Pain in unspecified foot: Secondary | ICD-10-CM

## 2010-11-19 DIAGNOSIS — M79609 Pain in unspecified limb: Secondary | ICD-10-CM

## 2010-11-19 DIAGNOSIS — L408 Other psoriasis: Secondary | ICD-10-CM

## 2010-11-19 MED ORDER — MELOXICAM 15 MG PO TABS: 15.0000 mg | ORAL_TABLET | Freq: Every day | ORAL | Status: DC

## 2010-11-19 NOTE — Patient Instructions (Addendum)
xrays today Stop over the counter pain medicines and take mobic instead with food daily as needed  Ice if you can  Always wear shoes  Will make plan based on results

## 2010-11-19 NOTE — Progress Notes (Signed)
Subjective:    Patient ID: [name], female    DOB: 1960/08/02, 50 y.o.   MRN: [id number]  HPI Here for foot pain  Saw Dr Para March for this in august and thought to have some arch problems from wearing flip flops  Adv better shoe wear  Also has psoriasis   Went and got arch supports - at [facility]  Wearing in her shoes  - no more flip flops  So far no help at all   Was originally R foot  Now L foot started this week - when it popped -- sitting at a table  Also hurting from the compensating for the other foot   occ gets a little groin pain on L   R foot- pain is in the heel primarily  Sharp and dull  Is worst when sitting and then has to get up on her feet - if she keeps moving - still hurts but improves enough to get around  Tried heat (too cold natured to use ice)    L foot - pain is across the top  Sharp and dull  Has to untie shoe -hurts top of the foot  Hurts whenever she walks on it   Neither pains keep her up at night   No swelling or bruising   Patient Active Problem List  Diagnoses  . ANXIETY  . TOBACCO ABUSE  . CARPAL TUNNEL SYNDROME  . ALLERGIC RHINITIS  . ASTHMA  . PSORIASIS, PUSTULAR  . SHOULDER PAIN, LEFT  . Foot pain   Past Medical History  Diagnosis Date  . Allergy   . Anxiety   . Asthma   . Osteoporosis   . Psoriasis   . Carpal tunnel syndrome   . Tobacco abuse   . Viral meningitis    Past Surgical History  Procedure Date  . Abdominal hysterectomy 12/2004    partial  . Cystectomy May 2011    Roof of mouth  . US transvaginal pelvic modified 12/2004    Fibroids  . Arm fracture     Bilateral   History  Substance Use Topics  . Smoking status: Current Everyday Smoker  . Smokeless tobacco: Not on file  . Alcohol Use: Not on file   Family History  Problem Relation Age of Onset  . COPD Mother   . Cancer Father     Lung CA, smoker   Allergies  Allergen Reactions  . Pseudoephedrine     REACTION: Heart races   Current Outpatient  Prescriptions on File Prior to Visit  Medication Sig Dispense Refill  . busPIRone (BUSPAR) 15 MG tablet Take 1/2 tablet two times a day for anxiety as needed.      . Multiple Vitamin (MULTIVITAMIN) tablet Take 1 tablet by mouth daily.        Marland Kitchen co-enzyme Q-10 30 MG capsule Take 30 mg by mouth daily.        Marland Kitchen ibuprofen (ADVIL,MOTRIN) 200 MG tablet Take 200 mg by mouth every 6 (six) hours as needed.                Review of Systems Review of Systems  Constitutional: Negative for fever, appetite change, fatigue and unexpected weight change.  Eyes: Negative for pain and visual disturbance.  Respiratory: Negative for cough and shortness of breath.   Cardiovascular: Negative for cp or palpitations    Gastrointestinal: Negative for nausea, diarrhea and constipation.  Genitourinary: Negative for urgency and frequency.  Skin: Negative for pallor or rash  MSK pos for foot pain without joint swelling or redness Neurological: Negative for weakness, light-headedness, numbness and headaches.  Hematological: Negative for adenopathy. Does not bruise/bleed easily.  Psychiatric/Behavioral: Negative for dysphoric mood. The patient is not nervous/anxious.          Objective:   Physical Exam  Constitutional: She appears well-developed and well-nourished. No distress.  HENT:  Head: Normocephalic and atraumatic.  Eyes: Conjunctivae and EOM are normal. Pupils are equal, round, and reactive to light.  Neck: Normal range of motion. Neck supple.  Cardiovascular: Normal rate, regular rhythm, normal heart sounds and intact distal pulses.   Pulmonary/Chest: Effort normal and breath sounds normal. No respiratory distress. She has no wheezes.  Musculoskeletal: She exhibits tenderness. She exhibits no edema.       R foot is diffusely tender over calcaneous and arch  Nl rom toes  No other bony tenderness  No edema in either foot Some psoriasis plaques on both plantar surfaces  L foot- tender over lateral  2 metacarpals  No plantar tenderness Nl rom toes   Gait is labored overall and favors L foot   Both feet warm and well perfused  Neurological: She is alert. She has normal strength and normal reflexes. No cranial nerve deficit or sensory deficit. She exhibits normal muscle tone. Coordination normal.  Skin: Skin is warm and dry. Rash noted. No erythema. No pallor.  Psychiatric: She has a normal mood and affect.          Assessment & Plan:

## 2010-11-22 ENCOUNTER — Telehealth: Payer: Self-pay | Admitting: *Deleted

## 2010-11-22 DIAGNOSIS — M79673 Pain in unspecified foot: Secondary | ICD-10-CM

## 2010-11-22 NOTE — Telephone Encounter (Signed)
Patient notified as instructed by telephone.Pt is agreeable to see podiatrist. Pt will wait to hear from pt care coordinator and can be reached [phone].

## 2010-11-22 NOTE — Telephone Encounter (Signed)
Left foot normal , R foot small heel spur (common with plantar fasciitis)  I would like to refer her to podiatry for further eval - let me know if aggreable

## 2010-11-22 NOTE — Telephone Encounter (Signed)
Here is order

## 2010-11-22 NOTE — Telephone Encounter (Signed)
Pt is calling for her x-ray results from last week.  Please review and advise.

## 2010-11-22 NOTE — Assessment & Plan Note (Signed)
Some on bottom of each foot  Need to r/o pos of related arthritis xrays today

## 2010-11-22 NOTE — Assessment & Plan Note (Signed)
Bilateral  Suspect plantar fasciitis in R foot and strain in L from favoring it (stress fx unlikely but needs to be ruled out) Info sheet on plantar fasciitis given  Enc ice / stretches and hard soled shoe In light of hx of psoriasis on feet- also poss of psoriatic arthritis is there.... Check xrays and adv further

## 2011-02-22 ENCOUNTER — Other Ambulatory Visit: Payer: Self-pay | Admitting: *Deleted

## 2011-02-22 MED ORDER — MELOXICAM 15 MG PO TABS: 15.0000 mg | ORAL_TABLET | Freq: Every day | ORAL | Status: DC

## 2011-02-22 NOTE — Telephone Encounter (Signed)
Will refill electronically  

## 2011-07-04 ENCOUNTER — Other Ambulatory Visit: Payer: Self-pay | Admitting: Family Medicine

## 2011-07-04 ENCOUNTER — Ambulatory Visit (INDEPENDENT_AMBULATORY_CARE_PROVIDER_SITE_OTHER): Payer: BC Managed Care – PPO | Admitting: Family Medicine

## 2011-07-04 ENCOUNTER — Ambulatory Visit: Payer: BC Managed Care – PPO

## 2011-07-04 ENCOUNTER — Encounter: Payer: Self-pay | Admitting: Family Medicine

## 2011-07-04 VITALS — BP 100/70 | HR 76 | Temp 97.8°F | Wt 139.2 lb

## 2011-07-04 DIAGNOSIS — Z1231 Encounter for screening mammogram for malignant neoplasm of breast: Secondary | ICD-10-CM

## 2011-07-04 DIAGNOSIS — S30860A Insect bite (nonvenomous) of lower back and pelvis, initial encounter: Secondary | ICD-10-CM

## 2011-07-04 DIAGNOSIS — Z803 Family history of malignant neoplasm of breast: Secondary | ICD-10-CM

## 2011-07-04 DIAGNOSIS — T148XXA Other injury of unspecified body region, initial encounter: Secondary | ICD-10-CM

## 2011-07-04 MED ORDER — BUSPIRONE HCL 15 MG PO TABS: 7.5000 mg | ORAL_TABLET | Freq: Two times a day (BID) | ORAL | Status: DC | PRN

## 2011-07-04 MED ORDER — ALPRAZOLAM 0.5 MG PO TABS: 0.2500 mg | ORAL_TABLET | Freq: Every evening | ORAL | Status: DC | PRN

## 2011-07-04 NOTE — Progress Notes (Signed)
Subjective:    Patient ID: [name], female    DOB: May 11, 1960, 51 y.o.   MRN: [id number]  HPI Found a tick this am -- under bra-  Daughter got most of it out  Put band aid on it   Thinks she got it working outdoors on Saturday  Was rather large- had ingested blood   Is feeling ok  No fever  No rash or head ache or st  No redness around the bite  Patient Active Problem List  Diagnoses  . ANXIETY  . TOBACCO ABUSE  . CARPAL TUNNEL SYNDROME  . ALLERGIC RHINITIS  . ASTHMA  . PSORIASIS, PUSTULAR  . SHOULDER PAIN, LEFT  . Foot pain   Past Medical History  Diagnosis Date  . Allergy   . Anxiety   . Asthma   . Osteoporosis   . Psoriasis   . Carpal tunnel syndrome   . Tobacco abuse   . Viral meningitis    Past Surgical History  Procedure Date  . Abdominal hysterectomy 12/2004    partial  . Cystectomy May 2011    Roof of mouth  . US transvaginal pelvic modified 12/2004    Fibroids  . Arm fracture     Bilateral   History  Substance Use Topics  . Smoking status: Current Everyday Smoker  . Smokeless tobacco: Never Used  . Alcohol Use: Yes     rarely   Family History  Problem Relation Age of Onset  . COPD Mother   . Cancer Father     Lung CA, smoker   Allergies  Allergen Reactions  . Pseudoephedrine     REACTION: Heart races   Current Outpatient Prescriptions on File Prior to Visit  Medication Sig Dispense Refill  . acetaminophen (TYLENOL) 500 MG tablet Take 500 mg by mouth every 6 (six) hours as needed.        . ALPRAZolam (XANAX) 0.5 MG tablet Take 0.25-0.5 mg by mouth at bedtime as needed.        . busPIRone (BUSPAR) 15 MG tablet Take 1/2 tablet two times a day for anxiety as needed.      . cetirizine (ZYRTEC) 10 MG tablet Take 10 mg by mouth daily.        Marland Kitchen ibuprofen (ADVIL,MOTRIN) 200 MG tablet Take 200 mg by mouth every 6 (six) hours as needed.        . meloxicam (MOBIC) 15 MG tablet Take 1 tablet (15 mg total) by mouth daily. With food  30 tablet   5  . Multiple Vitamin (MULTIVITAMIN) tablet Take 1 tablet by mouth daily.        . mupirocin (BACTROBAN) 2 % ointment Apply 1 application topically daily.            Review of Systems Review of Systems  Constitutional: Negative for fever, appetite change, fatigue and unexpected weight change.  Eyes: Negative for pain and visual disturbance.  Respiratory: Negative for cough and shortness of breath.   Cardiovascular: Negative for cp or palpitations    Gastrointestinal: Negative for nausea, diarrhea and constipation.  Genitourinary: Negative for urgency and frequency.  Skin: Negative for pallor or rash   Neurological: Negative for weakness, light-headedness, numbness and headaches.  Hematological: Negative for adenopathy. Does not bruise/bleed easily.  Psychiatric/Behavioral: Negative for dysphoric mood. The patient is not nervous/anxious.          Objective:   Physical Exam  Constitutional: She appears well-developed and well-nourished. No distress.  HENT:  Head: Normocephalic and atraumatic.  Mouth/Throat: Oropharynx is clear and moist.  Eyes: Conjunctivae and EOM are normal. Pupils are equal, round, and reactive to light.  Neck: Normal range of motion.  Cardiovascular: Normal rate and regular rhythm.   Pulmonary/Chest: Effort normal and breath sounds normal.  Musculoskeletal: She exhibits no edema.  Lymphadenopathy:    She has no cervical adenopathy.  Neurological: She is alert.  Skin: Skin is warm and dry. No rash noted. There is erythema. No pallor.       Tick embedded in skin R torso- head remains with small amt of erythema (no bulls eye pattern) Head of tick removed in total with sterile tweezers and area cleaned and dressed with abx ointment   Psychiatric: She has a normal mood and affect.          Assessment & Plan:

## 2011-07-04 NOTE — Patient Instructions (Signed)
I removed what was left of the tick today Clean area with antibacterial soap and water  Antibiotic ointment is ok  If redness/ target shape or other rash or fever or other symptoms let me know

## 2011-07-04 NOTE — Assessment & Plan Note (Addendum)
Tick head was removed with tweezers today  Cleaned and dressed with triple antibiotic ointment and band aid  Disc S/S to watch for tick fever/ rash  Will keep clean and watch carefully Disc use of insect repellent also

## 2011-07-05 ENCOUNTER — Ambulatory Visit: Payer: BC Managed Care – PPO

## 2011-07-06 ENCOUNTER — Ambulatory Visit
Admission: RE | Admit: 2011-07-06 | Discharge: 2011-07-06 | Disposition: A | Discharge status: Home / Self Care | Payer: BC Managed Care – PPO | Source: Ambulatory Visit | Attending: Family Medicine | Admitting: Family Medicine

## 2011-07-06 DIAGNOSIS — Z1231 Encounter for screening mammogram for malignant neoplasm of breast: Secondary | ICD-10-CM

## 2011-07-06 DIAGNOSIS — Z803 Family history of malignant neoplasm of breast: Secondary | ICD-10-CM

## 2011-10-21 ENCOUNTER — Other Ambulatory Visit: Payer: Self-pay | Admitting: *Deleted

## 2011-10-21 MED ORDER — MELOXICAM 15 MG PO TABS: 15.0000 mg | ORAL_TABLET | Freq: Every day | ORAL | Status: DC

## 2011-11-04 ENCOUNTER — Other Ambulatory Visit (INDEPENDENT_AMBULATORY_CARE_PROVIDER_SITE_OTHER): Payer: BC Managed Care – PPO

## 2011-11-04 ENCOUNTER — Telehealth: Payer: Self-pay | Admitting: Family Medicine

## 2011-11-04 DIAGNOSIS — Z0000 Encounter for general adult medical examination without abnormal findings: Secondary | ICD-10-CM

## 2011-11-04 LAB — CBC WITH DIFFERENTIAL/PLATELET
Basophils Absolute: 0 10*3/uL (ref 0.0–0.1)
Basophils Relative: 0.3 % (ref 0.0–3.0)
Eosinophils Absolute: 0.1 10*3/uL (ref 0.0–0.7)
Hemoglobin: 14 g/dL (ref 12.0–15.0)
Lymphocytes Relative: 22.4 % (ref 12.0–46.0)
MCHC: 34.4 g/dL (ref 30.0–36.0)
MCV: 94.5 fl (ref 78.0–100.0)
Monocytes Absolute: 0.9 10*3/uL (ref 0.1–1.0)
Neutro Abs: 6.1 10*3/uL (ref 1.4–7.7)
Neutrophils Relative %: 66.6 % (ref 43.0–77.0)
RDW: 12.1 % (ref 11.5–14.6)

## 2011-11-04 LAB — COMPREHENSIVE METABOLIC PANEL
ALT: 18 U/L (ref 0–35)
AST: 21 U/L (ref 0–37)
Alkaline Phosphatase: 65 U/L (ref 39–117)
BUN: 8 mg/dL (ref 6–23)
Calcium: 9.4 mg/dL (ref 8.4–10.5)
Chloride: 104 mEq/L (ref 96–112)
Creatinine, Ser: 0.8 mg/dL (ref 0.4–1.2)

## 2011-11-04 LAB — LIPID PANEL
Cholesterol: 189 mg/dL (ref 0–200)
Total CHOL/HDL Ratio: 5
Triglycerides: 226 mg/dL — ABNORMAL HIGH (ref 0.0–149.0)
VLDL: 45.2 mg/dL — ABNORMAL HIGH (ref 0.0–40.0)

## 2011-11-04 NOTE — Telephone Encounter (Signed)
Message copied by Judy Pimple on Fri Nov 04, 2011  6:10 AM ------      Message from: Alvina Chou      Created: Mon Oct 31, 2011  3:32 PM      Regarding: Lab orders for Friday 10.4.13       Patient is scheduled for CPX labs, please order future labs, Thanks , Camelia Eng

## 2011-11-11 ENCOUNTER — Ambulatory Visit (INDEPENDENT_AMBULATORY_CARE_PROVIDER_SITE_OTHER): Payer: BC Managed Care – PPO | Admitting: Family Medicine

## 2011-11-11 ENCOUNTER — Encounter: Payer: Self-pay | Admitting: Family Medicine

## 2011-11-11 VITALS — BP 118/76 | HR 78 | Temp 98.5°F | Ht 59.75 in | Wt 141.0 lb

## 2011-11-11 DIAGNOSIS — Z0000 Encounter for general adult medical examination without abnormal findings: Secondary | ICD-10-CM

## 2011-11-11 DIAGNOSIS — Z23 Encounter for immunization: Secondary | ICD-10-CM

## 2011-11-11 DIAGNOSIS — F17200 Nicotine dependence, unspecified, uncomplicated: Secondary | ICD-10-CM

## 2011-11-11 MED ORDER — MELOXICAM 15 MG PO TABS: 15.0000 mg | ORAL_TABLET | Freq: Every day | ORAL | Status: DC

## 2011-11-11 NOTE — Patient Instructions (Addendum)
Tdap vaccine today and flu vaccine today Exercise will help bring up your good cholesterol (HDL) Keep thinking about quitting smoking

## 2011-11-11 NOTE — Assessment & Plan Note (Signed)
Disc in detail risks of smoking and possible outcomes including copd, vascular/ heart disease, cancer , respiratory and sinus infections  Pt voices understanding She is not ready to quit yet - but needs to do so before grandchild is born in May Disc some strategies

## 2011-11-11 NOTE — Assessment & Plan Note (Signed)
Reviewed health habits including diet and exercise and skin cancer prevention Also reviewed health mt list, fam hx and immunizations  Wellness labs rev Some menop symptoms  Need to inc HDL with exercise Tdap Flu shots today

## 2011-11-11 NOTE — Progress Notes (Signed)
Subjective:    Patient ID: [name], female    DOB: 06-Oct-1960, 51 y.o.   MRN: [id number]  HPI Here for health maintenance exam and to review chronic medical problems    Is feeling fair  Is always tired  Is frustrated with wt gain  Legs tingle at night   Wt is up 2 lb with bmi of 27  Hx of partial hyst in the past - for fibroids (no cancer)   Flu shot- will get today  mammo 6/13 Self exam-no lumps or changes  Does occ get soreness    Td 04  Labs Lab Results  Component Value Date   CHOL 189 11/04/2011   CHOL 191 11/02/2009   Lab Results  Component Value Date   HDL 35.60* 11/04/2011   HDL 48.40 11/02/2009   Lab Results  Component Value Date   LDLCALC 129* 11/02/2009   Lab Results  Component Value Date   TRIG 226.0* 11/04/2011   TRIG 70.0 11/02/2009   Lab Results  Component Value Date   CHOLHDL 5 11/04/2011   CHOLHDL 4 11/02/2009   Lab Results  Component Value Date   LDLDIRECT 114.5 11/04/2011   HDL may be down from hormones Is really working on junk food intake     Chemistry      Component Value Date/Time   NA 137 11/04/2011 0913   K 4.1 11/04/2011 0913   CL 104 11/04/2011 0913   CO2 27 11/04/2011 0913   BUN 8 11/04/2011 0913   CREATININE 0.8 11/04/2011 0913      Component Value Date/Time   CALCIUM 9.4 11/04/2011 0913   ALKPHOS 65 11/04/2011 0913   AST 21 11/04/2011 0913   ALT 18 11/04/2011 0913   BILITOT 0.6 11/04/2011 0913      Glucose 103 She tries to avoid sugar and uses sugar subs instead , also decaf coffee   Smoking - about 1 ppd  No plans to quit soon  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 ANXIETY  . TOBACCO ABUSE  . CARPAL TUNNEL SYNDROME  . ALLERGIC RHINITIS  . ASTHMA  . PSORIASIS, PUSTULAR  . SHOULDER PAIN, LEFT  . Foot pain  . Tick bite of back  . Routine general medical examination at a health care facility   Past Medical History  Diagnosis Date  . Allergy   . Anxiety   . Asthma     . Osteoporosis   . Psoriasis   . Carpal tunnel syndrome   . Tobacco abuse   . Viral meningitis    Past Surgical History  Procedure Date  . Abdominal hysterectomy 12/2004    partial  . Cystectomy May 2011    Roof of mouth  . US transvaginal pelvic modified 12/2004    Fibroids  . Arm fracture     Bilateral   History  Substance Use Topics  . Smoking status: Current Every Day Smoker  . Smokeless tobacco: Never Used  . Alcohol Use: Yes     rarely   Family History  Problem Relation Age of Onset  . COPD Mother   . Cancer Father     Lung CA, smoker   Allergies  Allergen Reactions  . Pseudoephedrine     REACTION: Heart races   Current Outpatient Prescriptions on File Prior to Visit  Medication Sig Dispense Refill  . acetaminophen (TYLENOL) 500 MG tablet Take 500 mg by mouth every 6 (six) hours  as needed.        . ALPRAZolam (XANAX) 0.5 MG tablet Take 0.5-1 tablets (0.25-0.5 mg total) by mouth at bedtime as needed.  30 tablet  0  . busPIRone (BUSPAR) 15 MG tablet Take 0.5 tablets (7.5 mg total) by mouth 2 (two) times daily as needed. Take 1/2 tablet two times a day for anxiety as needed.  30 tablet  5  . cetirizine (ZYRTEC) 10 MG tablet Take 10 mg by mouth daily.        Marland Kitchen ibuprofen (ADVIL,MOTRIN) 200 MG tablet Take 200 mg by mouth every 6 (six) hours as needed.        . meloxicam (MOBIC) 15 MG tablet Take 1 tablet (15 mg total) by mouth daily. With food  30 tablet  0  . Multiple Vitamin (MULTIVITAMIN) tablet Take 1 tablet by mouth daily.        . mupirocin (BACTROBAN) 2 % ointment Apply 1 application topically daily.              Review of Systems Review of Systems  Constitutional: Negative for fever, appetite change, fatigue and unexpected weight change.  Eyes: Negative for pain and visual disturbance.  Respiratory: Negative for cough and shortness of breath.   Cardiovascular: Negative for cp or palpitations    Gastrointestinal: Negative for nausea, diarrhea and  constipation.  Genitourinary: Negative for urgency and frequency.  Skin: Negative for pallor or rash   Neurological: Negative for weakness, light-headedness, numbness and headaches.  Hematological: Negative for adenopathy. Does not bruise/bleed easily.  Psychiatric/Behavioral: Negative for dysphoric mood. The patient is not nervous/anxious.         Objective:   Physical Exam  Constitutional: She appears well-developed and well-nourished. No distress.       overwt and well appearing   HENT:  Head: Normocephalic and atraumatic.  Right Ear: External ear normal.  Left Ear: External ear normal.  Nose: Nose normal.  Mouth/Throat: No oropharyngeal exudate.  Eyes: Conjunctivae normal and EOM are normal. Pupils are equal, round, and reactive to light. Right eye exhibits no discharge. Left eye exhibits no discharge. No scleral icterus.  Neck: Normal range of motion. Neck supple. No JVD present. Carotid bruit is not present. No thyromegaly present.  Cardiovascular: Normal rate, regular rhythm, normal heart sounds and intact distal pulses.  Exam reveals no gallop.   Pulmonary/Chest: Effort normal and breath sounds normal. No respiratory distress. She has no wheezes.  Abdominal: Soft. Bowel sounds are normal. She exhibits no distension, no abdominal bruit and no mass. There is no tenderness.  Genitourinary: No breast swelling, tenderness, discharge or bleeding.       Breast exam: No mass, nodules, thickening, tenderness, bulging, retraction, inflamation, nipple discharge or skin changes noted.  No axillary or clavicular LA.  Chaperoned exam.    Musculoskeletal: She exhibits no edema and no tenderness.  Lymphadenopathy:    She has no cervical adenopathy.  Neurological: She is alert. She has normal reflexes. No cranial nerve deficit. She exhibits normal muscle tone. Coordination normal.  Skin: Skin is warm and dry. No rash noted. No erythema. No pallor.  Psychiatric: She has a normal mood and affect.           Assessment & Plan:

## 2012-01-18 ENCOUNTER — Other Ambulatory Visit: Payer: Self-pay | Admitting: Family Medicine

## 2012-01-19 NOTE — Telephone Encounter (Signed)
Rx called in as prescribed 

## 2012-01-19 NOTE — Telephone Encounter (Signed)
Ok to refill 

## 2012-01-19 NOTE — Telephone Encounter (Signed)
Px written for call in   

## 2012-02-20 ENCOUNTER — Ambulatory Visit (INDEPENDENT_AMBULATORY_CARE_PROVIDER_SITE_OTHER): Payer: BC Managed Care – PPO | Admitting: Family Medicine

## 2012-02-20 ENCOUNTER — Encounter: Payer: Self-pay | Admitting: Family Medicine

## 2012-02-20 VITALS — BP 130/90 | HR 92 | Temp 98.1°F | Wt 135.0 lb

## 2012-02-20 DIAGNOSIS — J019 Acute sinusitis, unspecified: Secondary | ICD-10-CM | POA: Insufficient documentation

## 2012-02-20 MED ORDER — AMOXICILLIN-POT CLAVULANATE 875-125 MG PO TABS: 1.0000 | ORAL_TABLET | Freq: Two times a day (BID) | ORAL | Status: DC

## 2012-02-20 NOTE — Patient Instructions (Addendum)
You have a sinus infection. Take medicine as prescribed: take augmentin twice daily for 10 days. Push fluids and plenty of rest. Nasal saline irrigation or neti pot to help drain sinuses. May use simple mucinex or guaifenesin with plenty of fluid to help mobilize mucous. May also use ibuprofen for sinus inflammation. Let us know if fever >101.5, trouble opening/closing mouth, difficulty swallowing, or worsening - you may need to be seen again.

## 2012-02-20 NOTE — Assessment & Plan Note (Signed)
Given duration of sxs, anticipate bacterial component vs developing chronic sinusitis. Treat with 10d course of augmnetin. See pt instructions for plan. Update [place] if not improving as expected or any red flags.

## 2012-02-20 NOTE — Progress Notes (Signed)
  Subjective:    Patient ID: [name], female    DOB: [birth year], 52 y.o.   MRN: [id number]  HPI CC: sinus  1.5 mo h/o sinus pressure HA behind eyes, nose bleeding, sinus congestion, some nausea/lightheadedness.  + PNdrainage and ear/tooth pain.  Mild cough productive of green sputum.  Green mucous when blowing nose.  Taking claritin D or zyrtec as well as mucinex.  No fevers, abd pain.  Sick co workers. Pt and fiance smokes.  Bought blue E cig. 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  Past Medical History  Diagnosis Date  . Allergy   . Anxiety   . Asthma   . Osteoporosis   . Psoriasis   . Carpal tunnel syndrome   . Tobacco abuse   . Viral meningitis     Review of Systems Per HPI    Objective:   Physical Exam  Nursing note and vitals reviewed. Constitutional: She appears well-developed and well-nourished. No distress.  HENT:  Head: Normocephalic and atraumatic.  Right Ear: Hearing, tympanic membrane, external ear and ear canal normal.  Left Ear: Hearing, tympanic membrane, external ear and ear canal normal.  Nose: No mucosal edema or rhinorrhea. Right sinus exhibits no maxillary sinus tenderness and no frontal sinus tenderness. Left sinus exhibits no maxillary sinus tenderness and no frontal sinus tenderness.  Mouth/Throat: Uvula is midline and mucous membranes are normal. Posterior oropharyngeal erythema present. No oropharyngeal exudate, posterior oropharyngeal edema or tonsillar abscesses.       Some sinus congestion  Eyes: Conjunctivae normal and EOM are normal. Pupils are equal, round, and reactive to light. No scleral icterus.  Neck: Normal range of motion. Neck supple.  Cardiovascular: Normal rate, regular rhythm, normal heart sounds and intact distal pulses.   No murmur heard. Pulmonary/Chest: Effort normal and breath sounds normal. No respiratory distress. She has no wheezes. She has no rales.    Lymphadenopathy:    She has no cervical adenopathy.  Skin: Skin is warm and dry. No rash noted.       Assessment & Plan:

## 2012-03-01 ENCOUNTER — Encounter: Payer: Self-pay | Admitting: Family Medicine

## 2012-03-01 ENCOUNTER — Telehealth: Payer: Self-pay | Admitting: Family Medicine

## 2012-03-01 ENCOUNTER — Ambulatory Visit (INDEPENDENT_AMBULATORY_CARE_PROVIDER_SITE_OTHER): Payer: BC Managed Care – PPO | Admitting: Family Medicine

## 2012-03-01 VITALS — BP 110/78 | HR 76 | Temp 97.9°F | Wt 130.8 lb

## 2012-03-01 DIAGNOSIS — J019 Acute sinusitis, unspecified: Secondary | ICD-10-CM

## 2012-03-01 MED ORDER — FLUTICASONE PROPIONATE 50 MCG/ACT NA SUSP: 2.0000 | Freq: Every day | NASAL | Status: DC

## 2012-03-01 MED ORDER — DOXYCYCLINE HYCLATE 100 MG PO CAPS: 100.0000 mg | ORAL_CAPSULE | Freq: Two times a day (BID) | ORAL | Status: DC

## 2012-03-01 NOTE — Progress Notes (Addendum)
  Subjective:    Patient ID: [name], female    DOB: Nov 21, 1960, 52 y.o.   MRN: [id number]  HPI CC: Actually today's complaint is continued rhinorrhea, not dizziness.  Seen 10d ago with dx acute sinusitis, treated with augmentin but unable to take 2/2 nausea.  Took this for 5 days.  Had been having 1.5 mo h/o sinus congestion.  2d ago when arrived home from work, had sneezing fit which since then has led to constant tickle in nose and L eye watering, continuous L>R rhinorrhea.  Continued dizziness as well esp with fast movements.  Main concern is drainage.  Thinks has low grade fevers.  Continued mild cough productive of green sputum.  Continued sinus pressure.    Taking zyrtec alternating with claritin D but not helping.  Raw nose.  Sick co workers.  Pt and fiance smokes. Bought blue E cig.  + h/o asthma, not on inhalers currently..   Past Medical History  Diagnosis Date  . Allergy   . Anxiety   . Asthma   . Osteoporosis   . Psoriasis   . Carpal tunnel syndrome   . Tobacco abuse   . Viral meningitis      Review of Systems Per HPI    Objective:   Physical Exam  Nursing note and vitals reviewed. Constitutional: She appears well-developed and well-nourished. No distress.       Evidently congested  HENT:  Head: Normocephalic and atraumatic.  Right Ear: Hearing, tympanic membrane, external ear and ear canal normal.  Left Ear: Hearing, tympanic membrane, external ear and ear canal normal.  Nose: Mucosal edema (nasal mucosal irritation and erythema) and rhinorrhea present. Right sinus exhibits no maxillary sinus tenderness and no frontal sinus tenderness. Left sinus exhibits no maxillary sinus tenderness and no frontal sinus tenderness.  Mouth/Throat: Uvula is midline, oropharynx is clear and moist and mucous membranes are normal. No oropharyngeal exudate, posterior oropharyngeal edema, posterior oropharyngeal erythema or tonsillar abscesses.  Eyes: EOM are normal. Pupils are  equal, round, and reactive to light. Right conjunctiva is not injected. Left conjunctiva is injected. No scleral icterus.       Bulbar conjunctivitis EOMI without pain  Neck: Normal range of motion. Neck supple.  Cardiovascular: Normal rate, regular rhythm, normal heart sounds and intact distal pulses.   No murmur heard. Pulmonary/Chest: Effort normal and breath sounds normal. No respiratory distress. She has no wheezes. She has no rales.  Lymphadenopathy:    She has no cervical adenopathy.  Skin: Skin is warm and dry. No rash noted.       Assessment & Plan:  Orthostatic vitals normal today.

## 2012-03-01 NOTE — Assessment & Plan Note (Addendum)
Anticipate incompletely treated sinusitis with new conjunctivitis (viral or allergic).  Will start doxy course today. For continuous rhinorrhea, recommended trial of allegra (zyrtec and claritin not currently helping) Trial of flonase as well.  Discussed epistaxis caution.  Pt states has used before. If not improving update [place].  Pt agrees with plan.

## 2012-03-01 NOTE — Telephone Encounter (Signed)
Patient Information:  Caller Name: Adrina  Phone: [phone]  Patient: [name], [name]  Gender: Female  DOB: Jul 08, 1960  Age: 52 Years  PCP: Eustaquio Boyden [facility])  Pregnant: No  Office Follow Up:  Does the office need to follow up with this patient?: Yes  Instructions For The Office: Patient would like to be called if an earlier appt becomes available.  Pt normally see Dr Milinda Antis.  Rn offered appts with other providers, but pt last saw Dr Reece Agar on 02/20/12 and wanted to see him again.   Symptoms  Reason For Call & Symptoms: pt reports she was in to see Dr Reece Agar on 02/20/12 for sinus infection.  Pt states that now the whole left side of face (nose and eye) is running and she is feeling dizzy  Reviewed Health History In EMR: Yes  Reviewed Medications In EMR: Yes  Reviewed Allergies In EMR: Yes  Reviewed Surgeries / Procedures: Yes  Date of Onset of Symptoms: 02/20/2012  Treatments Tried: amoxicillin (made her sick stopped taking it), guafenesin  Treatments Tried Worked: No OB / GYN:  LMP: Unknown  Guideline(s) Used:  Dizziness  Disposition Per Guideline:   See Today in Office  Reason For Disposition Reached:   Patient wants to be seen  Advice Given:  Call Back If:  You become worse.  Appointment Scheduled:  03/01/2012 16:15:00 Appointment Scheduled Provider:  Eustaquio Boyden [facility])

## 2012-03-01 NOTE — Telephone Encounter (Signed)
Will see today.  

## 2012-03-01 NOTE — Patient Instructions (Signed)
I think you have continued sinusitis - take 10 day doxycycline course. As claritin and zyrtec aren't helping, trial of allegra over the counter to help dry you up. Push fluids and plenty of rest.  Continue nasal saline. Try nasal steroid - flonase sent to pharmacy.

## 2012-05-08 ENCOUNTER — Other Ambulatory Visit: Payer: Self-pay

## 2012-05-08 MED ORDER — LORATADINE-PSEUDOEPHEDRINE ER 10-240 MG PO TB24: 1.0000 | ORAL_TABLET | Freq: Every day | ORAL | Status: DC

## 2012-05-08 NOTE — Telephone Encounter (Signed)
Pt saw Dr Reece Agar 02/2012; allegra not helping head congestion now and pt request refill claritin D  [facility] [address]. Pt has sneezing and prod cough with green phlegm; no fever. Pt using flonase but not nasal saline.Please advise.

## 2012-05-08 NOTE — Telephone Encounter (Signed)
Pt notified Rx sent to pharmacy and if med causes heart to race stop med

## 2012-05-08 NOTE — Telephone Encounter (Signed)
Will refill electronically  If this makes her heart race- stop it

## 2012-05-24 ENCOUNTER — Other Ambulatory Visit: Payer: Self-pay | Admitting: *Deleted

## 2012-05-24 MED ORDER — MELOXICAM 15 MG PO TABS: 15.0000 mg | ORAL_TABLET | Freq: Every day | ORAL | Status: DC

## 2012-05-24 MED ORDER — FLUTICASONE PROPIONATE 50 MCG/ACT NA SUSP: 2.0000 | Freq: Every day | NASAL | Status: DC

## 2012-05-24 NOTE — Telephone Encounter (Signed)
Pharmacy requested 90 day rx's of each med. I authorized the meloxicam for #90 x 1 which was the remainder of the original rx.

## 2012-11-01 ENCOUNTER — Other Ambulatory Visit: Payer: Self-pay | Admitting: Family Medicine

## 2012-11-19 ENCOUNTER — Other Ambulatory Visit: Payer: Self-pay | Admitting: Family Medicine

## 2012-11-19 NOTE — Telephone Encounter (Signed)
Electronic refill request, please advise  

## 2012-11-19 NOTE — Telephone Encounter (Signed)
Please schedule PE for winter and refill until then-thanks

## 2012-11-21 ENCOUNTER — Other Ambulatory Visit: Payer: Self-pay

## 2012-11-21 DIAGNOSIS — Z1231 Encounter for screening mammogram for malignant neoplasm of breast: Secondary | ICD-10-CM

## 2012-11-21 NOTE — Telephone Encounter (Signed)
CPE scheduled and meds refilled  

## 2012-12-12 ENCOUNTER — Ambulatory Visit
Admission: RE | Admit: 2012-12-12 | Discharge: 2012-12-12 | Disposition: A | Discharge status: Home / Self Care | Payer: BC Managed Care – PPO | Source: Ambulatory Visit

## 2012-12-12 ENCOUNTER — Telehealth: Payer: Self-pay | Admitting: Family Medicine

## 2012-12-12 DIAGNOSIS — Z0000 Encounter for general adult medical examination without abnormal findings: Secondary | ICD-10-CM

## 2012-12-12 DIAGNOSIS — Z1231 Encounter for screening mammogram for malignant neoplasm of breast: Secondary | ICD-10-CM

## 2012-12-12 NOTE — Telephone Encounter (Signed)
Message copied by Judy Pimple on Wed Dec 12, 2012 10:09 PM ------      Message from: Alvina Chou      Created: Tue Dec 04, 2012  4:15 PM      Regarding: Lab orders for Thursday, 11.13.14       Patient is scheduled for CPX labs, please order future labs, Thanks , Terri       ------

## 2012-12-13 ENCOUNTER — Other Ambulatory Visit (INDEPENDENT_AMBULATORY_CARE_PROVIDER_SITE_OTHER): Payer: BC Managed Care – PPO

## 2012-12-13 DIAGNOSIS — Z0000 Encounter for general adult medical examination without abnormal findings: Secondary | ICD-10-CM

## 2012-12-13 LAB — LIPID PANEL
Cholesterol: 218 mg/dL — ABNORMAL HIGH (ref 0–200)
HDL: 52.9 mg/dL (ref >39.00)
Total CHOL/HDL Ratio: 4
VLDL: 19.2 mg/dL (ref 0.0–40.0)

## 2012-12-13 LAB — CBC WITH DIFFERENTIAL/PLATELET
Basophils Absolute: 0 10*3/uL (ref 0.0–0.1)
Eosinophils Relative: 1.1 % (ref 0.0–5.0)
HCT: 39.9 % (ref 36.0–46.0)
Hemoglobin: 13.9 g/dL (ref 12.0–15.0)
Lymphs Abs: 1.8 10*3/uL (ref 0.7–4.0)
MCHC: 34.8 g/dL (ref 30.0–36.0)
MCV: 92.1 fl (ref 78.0–100.0)
Monocytes Absolute: 0.7 10*3/uL (ref 0.1–1.0)
Monocytes Relative: 8.3 % (ref 3.0–12.0)
Neutro Abs: 6.1 10*3/uL (ref 1.4–7.7)
Neutrophils Relative %: 69.9 % (ref 43.0–77.0)
Platelets: 345 10*3/uL (ref 150.0–400.0)
RDW: 12.5 % (ref 11.5–14.6)

## 2012-12-13 LAB — COMPREHENSIVE METABOLIC PANEL
ALT: 27 U/L (ref 0–35)
Alkaline Phosphatase: 69 U/L (ref 39–117)
CO2: 27 mEq/L (ref 19–32)
Chloride: 105 mEq/L (ref 96–112)
GFR: 88.05 mL/min (ref >60.00)
Glucose, Bld: 100 mg/dL — ABNORMAL HIGH (ref 70–99)
Sodium: 137 mEq/L (ref 135–145)
Total Bilirubin: 0.7 mg/dL (ref 0.3–1.2)
Total Protein: 7.2 g/dL (ref 6.0–8.3)

## 2012-12-13 LAB — LDL CHOLESTEROL, DIRECT: Direct LDL: 150 mg/dL

## 2012-12-17 ENCOUNTER — Encounter: Payer: Self-pay | Admitting: Family Medicine

## 2012-12-17 ENCOUNTER — Ambulatory Visit (INDEPENDENT_AMBULATORY_CARE_PROVIDER_SITE_OTHER): Payer: BC Managed Care – PPO | Admitting: Family Medicine

## 2012-12-17 VITALS — BP 128/86 | HR 97 | Temp 97.6°F | Ht 59.5 in | Wt 134.2 lb

## 2012-12-17 DIAGNOSIS — F17200 Nicotine dependence, unspecified, uncomplicated: Secondary | ICD-10-CM

## 2012-12-17 DIAGNOSIS — M810 Age-related osteoporosis without current pathological fracture: Secondary | ICD-10-CM

## 2012-12-17 DIAGNOSIS — Z0000 Encounter for general adult medical examination without abnormal findings: Secondary | ICD-10-CM

## 2012-12-17 DIAGNOSIS — Z23 Encounter for immunization: Secondary | ICD-10-CM

## 2012-12-17 DIAGNOSIS — Z1211 Encounter for screening for malignant neoplasm of colon: Secondary | ICD-10-CM | POA: Insufficient documentation

## 2012-12-17 DIAGNOSIS — E785 Hyperlipidemia, unspecified: Secondary | ICD-10-CM

## 2012-12-17 DIAGNOSIS — M8580 Other specified disorders of bone density and structure, unspecified site: Secondary | ICD-10-CM | POA: Insufficient documentation

## 2012-12-17 NOTE — Progress Notes (Signed)
Pre-visit discussion using our clinic review tool. No additional management support is needed unless otherwise documented below in the visit note.  

## 2012-12-17 NOTE — Patient Instructions (Signed)
Please do stool card for colon cancer screen We will schedule bone density test on the way out Try to get 1200-1500 mg of calcium per day with at least 1000 iu of vitamin D - for bone health - and exercise  Think about quitting smoking  Schedule fasting lab in 6 months and then follow up for cholesterol  Schedule an appt with me for mole removal

## 2012-12-17 NOTE — Assessment & Plan Note (Signed)
Reviewed health habits including diet and exercise and skin cancer prevention Also reviewed health mt list, fam hx and immunizations  Will return for mole removal Flu vaccine today

## 2012-12-17 NOTE — Progress Notes (Signed)
Subjective:    Patient ID: [name], female    DOB: [birth year], 52 y.o.   MRN: [id number]  HPI Here for health maintenance exam and to review chronic medical problems    Doing well overall  Tired from her schedule   Wt is stable with bmi of 26  Colon ca screen-is 52 this year - unsure if she wants colonoscopy yet , will do IFOB   Flu shot today  Pap 8/11-- had partial hysterectomy-- fibroids  No problems No hx of abn paps  Does not go to gyn doctor    Mammogram 11/14- was normal  No breast lumps on self exam   (has always had breast pain) Sister with breast ca   Td 10/13  Dexa 1/00 bone loss  Wants to schedule one  Ca and D-not taking - at all besides drinking milk   Smoking status - no change , and is not ready to quit yet   Lab Results  Component Value Date   WBC 8.8 12/13/2012   HGB 13.9 12/13/2012   HCT 39.9 12/13/2012   MCV 92.1 12/13/2012   PLT 345.0 12/13/2012     Chemistry      Component Value Date/Time   NA 137 12/13/2012 0840   K 4.4 12/13/2012 0840   CL 105 12/13/2012 0840   CO2 27 12/13/2012 0840   BUN 13 12/13/2012 0840   CREATININE 0.7 12/13/2012 0840      Component Value Date/Time   CALCIUM 9.4 12/13/2012 0840   ALKPHOS 69 12/13/2012 0840   AST 24 12/13/2012 0840   ALT 27 12/13/2012 0840   BILITOT 0.7 12/13/2012 0840     Lab Results  Component Value Date   TSH 0.48 12/13/2012   Lab Results  Component Value Date   CHOL 218* 12/13/2012   CHOL 189 11/04/2011   CHOL 191 11/02/2009   Lab Results  Component Value Date   HDL 52.90 12/13/2012   HDL 35.60* 11/04/2011   HDL 48.40 11/02/2009   Lab Results  Component Value Date   LDLCALC 129* 11/02/2009   Lab Results  Component Value Date   TRIG 96.0 12/13/2012   TRIG 226.0* 11/04/2011   TRIG 70.0 11/02/2009   Lab Results  Component Value Date   CHOLHDL 4 12/13/2012   CHOLHDL 5 11/04/2011   CHOLHDL 4 11/02/2009   Lab Results  Component Value Date   LDLDIRECT 150.0 12/13/2012    LDLDIRECT 114.5 11/04/2011   eating the same overall - not a big appetite  occ eats red meat  occ fried foods   Patient Active Problem List   Diagnosis Date Noted  . Osteoporosis, unspecified 12/17/2012  . Acute sinusitis 02/20/2012  . Routine general medical examination at a health care facility 11/04/2011  . Tick bite of back 07/04/2011  . Foot pain 09/27/2010  . SHOULDER PAIN, LEFT 11/06/2009  . CARPAL TUNNEL SYNDROME 11/22/2006  . ANXIETY 11/21/2006  . ALLERGIC RHINITIS 11/21/2006  . ASTHMA 11/21/2006  . 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 TOBACCO ABUSE 09/13/2006   Past Medical History  Diagnosis Date  . Allergy   . Anxiety   . Asthma   . Osteoporosis   . Psoriasis   . Carpal tunnel syndrome   . Tobacco abuse   . Viral meningitis    Past Surgical History  Procedure Laterality Date  . Abdominal hysterectomy  12/2004    partial  . Cystectomy  May 2011    Roof of mouth  .  US transvaginal pelvic modified  12/2004    Fibroids  . Arm fracture      Bilateral   History  Substance Use Topics  . Smoking status: Current Every Day Smoker -- 1.00 packs/day    Types: Cigarettes  . Smokeless tobacco: Never Used  . Alcohol Use: Yes     Comment: rarely   Family History  Problem Relation Age of Onset  . COPD Mother   . Cancer Father     Lung CA, smoker   Allergies  Allergen Reactions  . Augmentin [Amoxicillin-Pot Clavulanate] Nausea Only  . Pseudoephedrine     REACTION: Heart races   Current Outpatient Prescriptions on File Prior to Visit  Medication Sig Dispense Refill  . acetaminophen (TYLENOL) 500 MG tablet Take 500 mg by mouth every 6 (six) hours as needed.        . ALPRAZolam (XANAX) 0.5 MG tablet TAKE 1/2 TO 1 TABLET BY MOUTH AT BEDTIME AS NEEDED  30 tablet  0  . busPIRone (BUSPAR) 15 MG tablet Take 0.5 tablets (7.5 mg total) by mouth 2 (two) times daily as needed. Take 1/2 tablet two times a day for anxiety as needed.  30 tablet  5  . [facility] LORATADINE-D 24  HOUR 10-240 MG per 24 hr tablet TAKE 1 TABLET BY MOUTH DAILY.  30 tablet  5  . fluticasone (FLONASE) 50 MCG/ACT nasal spray Place 2 sprays into the nose daily.  48 g  0  . ibuprofen (ADVIL,MOTRIN) 200 MG tablet Take 200 mg by mouth every 6 (six) hours as needed.        . meloxicam (MOBIC) 15 MG tablet TAKE 1 TABLET (15 MG TOTAL) BY MOUTH DAILY. WITH FOOD  90 tablet  0  . Multiple Vitamin (MULTIVITAMIN) tablet Take 1 tablet by mouth daily.        . mupirocin (BACTROBAN) 2 % ointment Apply 1 application topically daily.         No current facility-administered medications on file prior to visit.      Review of Systems Review of Systems  Constitutional: Negative for fever, appetite change, fatigue and unexpected weight change.  Eyes: Negative for pain and visual disturbance.  Respiratory: Negative for cough and shortness of breath.   Cardiovascular: Negative for cp or palpitations    Gastrointestinal: Negative for nausea, diarrhea and constipation.  Genitourinary: Negative for urgency and frequency.  Skin: Negative for pallor or rash   Neurological: Negative for weakness, light-headedness, numbness and headaches.  Hematological: Negative for adenopathy. Does not bruise/bleed easily.  Psychiatric/Behavioral: Negative for dysphoric mood. The patient is not nervous/anxious.         Objective:   Physical Exam  Constitutional: She appears well-developed and well-nourished. No distress.  HENT:  Head: Normocephalic and atraumatic.  Right Ear: External ear normal.  Left Ear: External ear normal.  Nose: Nose normal.  Mouth/Throat: Oropharynx is clear and moist.  Eyes: Conjunctivae and EOM are normal. Pupils are equal, round, and reactive to light. Right eye exhibits no discharge. Left eye exhibits no discharge. No scleral icterus.  Neck: Normal range of motion. Neck supple. No JVD present. No thyromegaly present.  Cardiovascular: Normal rate, regular rhythm, normal heart sounds and intact  distal pulses.  Exam reveals no gallop.   Pulmonary/Chest: Effort normal and breath sounds normal. No respiratory distress. She has no wheezes. She has no rales.  Somewhat distant bs  Abdominal: Soft. Bowel sounds are normal. She exhibits no distension and no mass. There  is no tenderness.  Genitourinary: No breast swelling, tenderness, discharge or bleeding.  Breast exam: No mass, nodules, thickening, tenderness, bulging, retraction, inflamation, nipple discharge or skin changes noted.  No axillary or clavicular LA.  Chaperoned exam.    Musculoskeletal: She exhibits no edema and no tenderness.  Lymphadenopathy:    She has no cervical adenopathy.  Neurological: She is alert. She has normal reflexes. No cranial nerve deficit. She exhibits normal muscle tone. Coordination normal.  Skin: Skin is warm and dry. No rash noted. No erythema. No pallor.  2-3 mm scaley skin lesion on mid chest - has a warty app Pt will return for mole removal  Psychiatric: She has a normal mood and affect.          Assessment & Plan:

## 2012-12-17 NOTE — Assessment & Plan Note (Signed)
Disc in detail risks of smoking and possible outcomes including copd, vascular/ heart disease, cancer , respiratory and sinus infections  Pt voices understanding She is not ready to quit  

## 2012-12-17 NOTE — Assessment & Plan Note (Signed)
dexa schedu Counseled re: smoking Disc need for calcium/ vitamin D/ wt bearing exercise and bone density test every 2 y to monitor Disc safety/ fracture risk in detail

## 2012-12-17 NOTE — Assessment & Plan Note (Signed)
Lipids are up  LDL 150  Diet fair Disc goals for lipids and reasons to control them Rev labs with pt Rev low sat fat diet in detail  Lab and f/u 6 mo

## 2012-12-17 NOTE — Assessment & Plan Note (Signed)
D/w patient [name] for colon cancer screening, including IFOB vs. colonoscopy.  Risks and benefits of both were discussed and patient voiced understanding.  Pt elects NGE:XBMW card May consider colonosc later

## 2012-12-26 ENCOUNTER — Other Ambulatory Visit (INDEPENDENT_AMBULATORY_CARE_PROVIDER_SITE_OTHER): Payer: BC Managed Care – PPO

## 2012-12-26 DIAGNOSIS — Z1211 Encounter for screening for malignant neoplasm of colon: Secondary | ICD-10-CM

## 2012-12-26 LAB — FECAL OCCULT BLOOD, IMMUNOCHEMICAL: Fecal Occult Bld: NEGATIVE

## 2012-12-28 ENCOUNTER — Encounter: Payer: Self-pay | Admitting: Family Medicine

## 2012-12-28 ENCOUNTER — Ambulatory Visit (INDEPENDENT_AMBULATORY_CARE_PROVIDER_SITE_OTHER): Payer: BC Managed Care – PPO | Admitting: Family Medicine

## 2012-12-28 VITALS — BP 128/78 | HR 84 | Temp 97.7°F | Ht 59.5 in | Wt 136.2 lb

## 2012-12-28 DIAGNOSIS — D492 Neoplasm of unspecified behavior of bone, soft tissue, and skin: Secondary | ICD-10-CM

## 2012-12-28 NOTE — Progress Notes (Signed)
Pre-visit discussion using our clinic review tool. No additional management support is needed unless otherwise documented below in the visit note.  

## 2012-12-28 NOTE — Progress Notes (Signed)
Subjective:    Patient ID: [name], female    DOB: 1960/06/01, 52 y.o.   MRN: [id number]  HPI Here for removal of rough textured mole on chest This is fairly new Has a warty appearance  Catches on clothes May be enlarging  Neg for allergy to iodine or any numbing medicines   Patient Active Problem List   Diagnosis Date Noted  . Neoplasm of skin 12/28/2012  . Osteoporosis, unspecified 12/17/2012  . Colon cancer screening 12/17/2012  . Hyperlipidemia 12/17/2012  . Routine general medical examination at a health care facility 11/04/2011  . CARPAL TUNNEL SYNDROME 11/22/2006  . ANXIETY 11/21/2006  . ALLERGIC RHINITIS 11/21/2006  . ASTHMA 11/21/2006  . 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 TOBACCO ABUSE 09/13/2006   Past Medical History  Diagnosis Date  . Allergy   . Anxiety   . Asthma   . Osteoporosis   . Psoriasis   . Carpal tunnel syndrome   . Tobacco abuse   . Viral meningitis    Past Surgical History  Procedure Laterality Date  . Abdominal hysterectomy  12/2004    partial  . Cystectomy  May 2011    Roof of mouth  . US transvaginal pelvic modified  12/2004    Fibroids  . Arm fracture      Bilateral   History  Substance Use Topics  . Smoking status: Current Every Day Smoker -- 1.00 packs/day    Types: Cigarettes  . Smokeless tobacco: Never Used  . Alcohol Use: Yes     Comment: rarely   Family History  Problem Relation Age of Onset  . COPD Mother   . Cancer Father     Lung CA, smoker   Allergies  Allergen Reactions  . Augmentin [Amoxicillin-Pot Clavulanate] Nausea Only  . Pseudoephedrine     REACTION: Heart races   Current Outpatient Prescriptions on File Prior to Visit  Medication Sig Dispense Refill  . acetaminophen (TYLENOL) 500 MG tablet Take 500 mg by mouth every 6 (six) hours as needed.        . ALPRAZolam (XANAX) 0.5 MG tablet TAKE 1/2 TO 1 TABLET BY MOUTH AT BEDTIME AS NEEDED  30 tablet  0  . busPIRone (BUSPAR) 15 MG tablet Take 0.5  tablets (7.5 mg total) by mouth 2 (two) times daily as needed. Take 1/2 tablet two times a day for anxiety as needed.  30 tablet  5  . [facility] LORATADINE-D 24 HOUR 10-240 MG per 24 hr tablet TAKE 1 TABLET BY MOUTH DAILY.  30 tablet  5  . fluticasone (FLONASE) 50 MCG/ACT nasal spray Place 2 sprays into the nose daily.  48 g  0  . ibuprofen (ADVIL,MOTRIN) 200 MG tablet Take 200 mg by mouth every 6 (six) hours as needed.        . meloxicam (MOBIC) 15 MG tablet TAKE 1 TABLET (15 MG TOTAL) BY MOUTH DAILY. WITH FOOD  90 tablet  0  . Multiple Vitamin (MULTIVITAMIN) tablet Take 1 tablet by mouth daily.        . mupirocin (BACTROBAN) 2 % ointment Apply 1 application topically daily.         No current facility-administered medications on file prior to visit.      Review of Systems Review of Systems  Constitutional: Negative for fever, appetite change, fatigue and unexpected weight change.  Eyes: Negative for pain and visual disturbance.  Respiratory: Negative for cough and shortness of breath.   Cardiovascular: Negative for cp  or palpitations    Gastrointestinal: Negative for nausea, diarrhea and constipation.  Genitourinary: Negative for urgency and frequency.  Skin: Negative for pallor or rash  neg for itching/ pos for mole  Neurological: Negative for weakness, light-headedness, numbness and headaches.  Hematological: Negative for adenopathy. Does not bruise/bleed easily.  Psychiatric/Behavioral: Negative for dysphoric mood. The patient is not nervous/anxious.         Objective:   Physical Exam  Constitutional: She appears well-developed and well-nourished. No distress.  Eyes: Conjunctivae and EOM are normal. Pupils are equal, round, and reactive to light.  Neck: Normal range of motion. Neck supple.  Lymphadenopathy:    She has no cervical adenopathy.  Skin: Skin is warm and dry. No rash noted. No erythema. No pallor.  3-4 mm pink keratotic lesion on mid chest  nt and somewhat inflamed  appearing   Consent for procedure acheived Area cleaned and prepped in sterile fashion and aneth with 1% xylocaine with epi (.5 cc) Removed with shave technique/ # 11 scalpel Hemostasis with pressure and silver nitrate stick Cleaned and dressed with triple abx oint and loose band aid  Pt tol well   Psychiatric: She has a normal mood and affect.          Assessment & Plan:

## 2012-12-28 NOTE — Patient Instructions (Signed)
Keep mole site clean and dry with soap and water  You can dress with antibiotic ointment and band aid until healed  If any problems - pain/ swelling or signs of infection-please let me know We will inform you of pathology results

## 2012-12-30 NOTE — Assessment & Plan Note (Signed)
Keratotic growth removed - differential includes wart vs squamous cell lesion  Sent for path Aftercare disc  Skin care prevention discussed

## 2013-01-03 ENCOUNTER — Encounter: Payer: Self-pay | Admitting: *Deleted

## 2013-01-08 ENCOUNTER — Other Ambulatory Visit: Payer: Self-pay | Admitting: Family Medicine

## 2013-01-17 ENCOUNTER — Telehealth: Payer: Self-pay | Admitting: Family Medicine

## 2013-01-17 ENCOUNTER — Ambulatory Visit
Admission: RE | Admit: 2013-01-17 | Discharge: 2013-01-17 | Disposition: A | Discharge status: Home / Self Care | Payer: BC Managed Care – PPO | Source: Ambulatory Visit | Attending: Family Medicine | Admitting: Family Medicine

## 2013-01-17 DIAGNOSIS — M810 Age-related osteoporosis without current pathological fracture: Secondary | ICD-10-CM

## 2013-01-17 NOTE — Telephone Encounter (Signed)
Patient Information:  Caller Name: Jamarria  Phone: [phone]  Patient: [name], [name]  Gender: Female  DOB: [birth year]  Age: 52 Years  PCP: Roxy Manns [facility])  Pregnant: No  Office Follow Up:  Does the office need to follow up with this patient?: No  Instructions For The Office: N/A  RN Note:  Hysterectomy. Informed must be seen for antibiotic per MD orders. Mild sinus pressure around both eyes. Moderate, intermittent headaches. Nose bled when blew nose. Hydrate and humidify.  Symptoms  Reason For Call & Symptoms: Called for antibiotic for "sinus infection."  Reports lots of green nasal mucus.  Smokes.   Reviewed Health History In EMR: Yes  Reviewed Medications In EMR: Yes  Reviewed Allergies In EMR: Yes  Reviewed Surgeries / Procedures: Yes  Date of Onset of Symptoms: 01/15/2013  Treatments Tried: Loratidine-D  Treatments Tried Worked: No OB / GYN:  LMP: Unknown  Guideline(s) Used:  Sinus Pain and Congestion  Disposition Per Guideline:   See Today or Tomorrow in Office  Reason For Disposition Reached:   Patient wants to be seen  Advice Given:  Reassurance:   Sinus congestion is a normal part of a cold.  Usually home treatment with nasal washes can prevent an actual bacterial sinus infection.  Antibiotics are not helpful for the sinus congestion that occurs with colds.  Here is some care advice that should help.  For a Runny Nose With Profuse Discharge:  Nasal mucus and discharge helps to wash viruses and bacteria out of the nose and sinuses.  Blowing the nose is all that is needed.  For a Stuffy Nose - Use Nasal Washes:  Introduction: Saline (salt water) nasal irrigation (nasal wash) is an effective and simple home remedy for treating stuffy nose and sinus congestion. The nose can be irrigated by pouring, spraying, or squirting salt water into the nose and then letting it run back out.  Methods: There are several ways to perform nasal irrigation. You  can use a saline nasal spray bottle (available over-the-counter), a rubber ear syringe, a medical syringe without the needle, or a Neti Pot.  Medicines for a Stuffy or Runny Nose:  Most cold medicines that are available over-the-counter (OTC) are not helpful.  Antihistamines: Are only helpful if you also have nasal allergies.  Hydration:  Drink plenty of liquids (6-8 glasses of water daily). If the air in your home is dry, use a cool mist humidifier  Expected Course:  Sinus congestion from viral upper respiratory infections (colds) usually lasts 5-10 days.  Occasionally a cold can worsen and turn into bacterial sinusitis. Clues to this are sinus symptoms lasting longer than 10 days, fever lasting longer than 3 days, and worsening pain. Bacterial sinusitis may need antibiotic treatment.  Call Back If:   Sinus congestion (fullness) lasts longer than 10 days  Fever lasts longer than 3 days  You become worse.  Patient Will Follow Care Advice:  YES  Appointment Scheduled:  01/18/2013 16:00:00 Appointment Scheduled Provider:  Kerby Nora [facility])

## 2013-01-18 ENCOUNTER — Ambulatory Visit (INDEPENDENT_AMBULATORY_CARE_PROVIDER_SITE_OTHER): Payer: BC Managed Care – PPO | Admitting: Family Medicine

## 2013-01-18 ENCOUNTER — Encounter: Payer: Self-pay | Admitting: Family Medicine

## 2013-01-18 VITALS — BP 120/80 | HR 89 | Temp 97.7°F | Ht 59.5 in | Wt 136.2 lb

## 2013-01-18 DIAGNOSIS — J019 Acute sinusitis, unspecified: Secondary | ICD-10-CM

## 2013-01-18 MED ORDER — CLARITHROMYCIN 500 MG PO TABS: 500.0000 mg | ORAL_TABLET | Freq: Two times a day (BID) | ORAL | Status: DC

## 2013-01-18 NOTE — Patient Instructions (Addendum)
Continuing loratadine D, nasal steroid.  Add nasal saline and mucinex DM.  Complete antibiotics.  Push fluids, rest. Call if not improving as expected.

## 2013-01-18 NOTE — Progress Notes (Signed)
Pre-visit discussion using our clinic review tool. No additional management support is needed unless otherwise documented below in the visit note.  

## 2013-01-18 NOTE — Assessment & Plan Note (Signed)
Pt with frequent recurrent sinus infection, sinus pressure and tooth pain. No improvement on 5 days of decongestant.  Will treat with antibiotics, nasal saline irrigation and mucolytic.

## 2013-01-18 NOTE — Progress Notes (Signed)
   Subjective:    Patient ID: [name], female    DOB: July 06, 1960, 52 y.o.   MRN: [id number]  HPI Comments: Has hx of recurrent sinus infection.  Last sinus infection earlier this year.  Sinusitis This is a new problem. The current episode started in the past 7 days (5-6 days). The problem has been gradually worsening since onset. There has been no fever. Her pain is at a severity of 5/10. The pain is moderate. Associated symptoms include congestion, coughing, sinus pressure and a sore throat. Pertinent negatives include no ear pain, shortness of breath, sneezing or swollen glands. (Greenish nasal discharge, with bloody streaks in mucus) Past treatments include oral decongestants (on loratidine D, nasal steroid spray). The treatment provided mild relief.      Review of Systems  HENT: Positive for congestion, sinus pressure and sore throat. Negative for ear pain and sneezing.   Respiratory: Positive for cough. Negative for shortness of breath.        Objective:   Physical Exam  Constitutional: Vital signs are normal. She appears well-developed and well-nourished. She is cooperative.  Non-toxic appearance. She does not appear ill. No distress.  HENT:  Head: Normocephalic.  Right Ear: Hearing, external ear and ear canal normal. Tympanic membrane is not erythematous, not retracted and not bulging. A middle ear effusion is present.  Left Ear: Hearing, external ear and ear canal normal. Tympanic membrane is not erythematous, not retracted and not bulging. A middle ear effusion is present.  Nose: Mucosal edema and rhinorrhea present. Right sinus exhibits maxillary sinus tenderness. Right sinus exhibits no frontal sinus tenderness. Left sinus exhibits maxillary sinus tenderness. Left sinus exhibits no frontal sinus tenderness.  Mouth/Throat: Uvula is midline, oropharynx is clear and moist and mucous membranes are normal.  Eyes: Conjunctivae, EOM and lids are normal. Pupils are equal, round,  and reactive to light. Lids are everted and swept, no foreign bodies found.  Neck: Trachea normal and normal range of motion. Neck supple. Carotid bruit is not present. No mass and no thyromegaly present.  Cardiovascular: Normal rate, regular rhythm, S1 normal, S2 normal, normal heart sounds, intact distal pulses and normal pulses.  Exam reveals no gallop and no friction rub.   No murmur heard. Pulmonary/Chest: Effort normal and breath sounds normal. Not tachypneic. No respiratory distress. She has no decreased breath sounds. She has no wheezes. She has no rhonchi. She has no rales.  Neurological: She is alert.  Skin: Skin is warm, dry and intact. No rash noted.  Psychiatric: Her speech is normal and behavior is normal. Judgment normal. Her mood appears not anxious. Cognition and memory are normal. She does not exhibit a depressed mood.          Assessment & Plan:

## 2013-01-21 ENCOUNTER — Encounter: Payer: Self-pay | Admitting: *Deleted

## 2013-02-03 ENCOUNTER — Other Ambulatory Visit: Payer: Self-pay | Admitting: Family Medicine

## 2013-02-21 ENCOUNTER — Other Ambulatory Visit: Payer: Self-pay | Admitting: Family Medicine

## 2013-02-21 MED ORDER — MELOXICAM 15 MG PO TABS: ORAL_TABLET | ORAL | Status: DC

## 2013-02-21 NOTE — Telephone Encounter (Signed)
Will refill electronically  

## 2013-02-21 NOTE — Telephone Encounter (Signed)
Fax refill request, please advise  

## 2013-04-29 ENCOUNTER — Other Ambulatory Visit: Payer: Self-pay | Admitting: Family Medicine

## 2013-04-30 ENCOUNTER — Other Ambulatory Visit: Payer: Self-pay | Admitting: *Deleted

## 2013-04-30 MED ORDER — LORATADINE-PSEUDOEPHEDRINE ER 10-240 MG PO TB24: ORAL_TABLET | ORAL | Status: DC

## 2013-05-09 ENCOUNTER — Telehealth: Payer: Self-pay

## 2013-05-09 NOTE — Telephone Encounter (Signed)
Pt left v/m requesting reason meloxicam was not recently refilled; spoke with Cristie Hem at [facility]; pt has refill available on meloxicam. Left v/m for pt meloxicam refill available at [facility].

## 2013-09-03 ENCOUNTER — Other Ambulatory Visit: Payer: Self-pay | Admitting: Family Medicine

## 2013-11-30 ENCOUNTER — Other Ambulatory Visit: Payer: Self-pay | Admitting: Family Medicine

## 2013-12-02 NOTE — Telephone Encounter (Signed)
electronic refill request, please advise

## 2013-12-02 NOTE — Telephone Encounter (Signed)
Please schedule winter f/u and refill until then 

## 2013-12-03 NOTE — Telephone Encounter (Signed)
appt scheduled and med refilled 

## 2013-12-27 ENCOUNTER — Other Ambulatory Visit: Payer: Self-pay | Admitting: Family Medicine

## 2013-12-30 NOTE — Telephone Encounter (Signed)
Please verify with pt that she is taking this - if it does not cause side eff (heart racing) then refill for a month She has upcoming appt

## 2013-12-30 NOTE — Telephone Encounter (Signed)
Last office visit 01/18/13/acute. See allergy/contrainidcation. Is it okay to refill medication?

## 2014-01-01 NOTE — Telephone Encounter (Signed)
Pt does take Rx, Rx filled

## 2014-01-02 ENCOUNTER — Other Ambulatory Visit: Payer: Self-pay | Admitting: Family Medicine

## 2014-01-15 ENCOUNTER — Ambulatory Visit: Payer: BC Managed Care – PPO | Admitting: Family Medicine

## 2014-01-15 ENCOUNTER — Encounter: Payer: Self-pay | Admitting: Family Medicine

## 2014-01-15 ENCOUNTER — Ambulatory Visit (INDEPENDENT_AMBULATORY_CARE_PROVIDER_SITE_OTHER): Payer: PRIVATE HEALTH INSURANCE | Admitting: Family Medicine

## 2014-01-15 VITALS — BP 124/70 | HR 91 | Temp 98.2°F | Ht 59.5 in | Wt 139.0 lb

## 2014-01-15 DIAGNOSIS — F411 Generalized anxiety disorder: Secondary | ICD-10-CM

## 2014-01-15 DIAGNOSIS — Z720 Tobacco use: Secondary | ICD-10-CM | POA: Diagnosis not present

## 2014-01-15 DIAGNOSIS — J309 Allergic rhinitis, unspecified: Secondary | ICD-10-CM | POA: Diagnosis not present

## 2014-01-15 DIAGNOSIS — M25562 Pain in left knee: Secondary | ICD-10-CM | POA: Insufficient documentation

## 2014-01-15 DIAGNOSIS — F17200 Nicotine dependence, unspecified, uncomplicated: Secondary | ICD-10-CM

## 2014-01-15 MED ORDER — BUSPIRONE HCL 15 MG PO TABS: 7.5000 mg | ORAL_TABLET | Freq: Two times a day (BID) | ORAL | Status: DC | PRN

## 2014-01-15 MED ORDER — LORATADINE-PSEUDOEPHEDRINE ER 10-240 MG PO TB24: 1.0000 | ORAL_TABLET | Freq: Every day | ORAL | Status: DC

## 2014-01-15 MED ORDER — MELOXICAM 15 MG PO TABS: 15.0000 mg | ORAL_TABLET | Freq: Every day | ORAL | Status: DC

## 2014-01-15 NOTE — Patient Instructions (Signed)
Schedule an annual exam in late spring with labs prior  Take care of yourself  Think about quitting smoking  Get your mammogram  Start buspar if you feel like you need it - I refilled that  For knee- use ice when you can and elevate it - take meloxicam  (avoid over bending or extending it)  If no improvement let me know

## 2014-01-15 NOTE — Progress Notes (Signed)
Subjective:    Patient ID: [name], female    DOB: 06/17/1960, 52 y.o.   MRN: [id number]  HPI Here for follow up of chronic medical problems   Also R ear started hurting Monday and that side of her throat  Hurt to drink carbonation - has improved over the past few days   Also for knee pain  L knee  No injury that she knows of  Feels a little tight  Hurts to fully extend it  No exercise program  Hurts more on the medial side  No hx of knee problems   Wt is up 3 lb with bmi of 27   Mood has been fair  A little more irritable - working a lot of hours  Hired someone so that may slow down a bit  Also taking care of a grandchild - on the weekends   (great but she cannot get anything done)  Fiance just lost his job - looking  She has not resorted to taking any xanax yet  She also has not taken buspar   Still taking her claritin D -helping her sinuses most of the time  We px that  Has chronic congestion   Smoking the same -not ready to quit   Is taking meloxicam - no stomach problems from that   Patient Active Problem List   Diagnosis Date Noted  . Acute sinus infection 01/18/2013  . Neoplasm of skin 12/28/2012  . Osteoporosis, unspecified 12/17/2012  . Colon cancer screening 12/17/2012  . Hyperlipidemia 12/17/2012  . Routine general medical examination at a health care facility 11/04/2011  . CARPAL TUNNEL SYNDROME 11/22/2006  . ANXIETY 11/21/2006  . ALLERGIC RHINITIS 11/21/2006  . ASTHMA 11/21/2006  . 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 TOBACCO ABUSE 09/13/2006   Past Medical History  Diagnosis Date  . Allergy   . Anxiety   . Asthma   . Osteoporosis   . Psoriasis   . Carpal tunnel syndrome   . Tobacco abuse   . Viral meningitis    Past Surgical History  Procedure Laterality Date  . Abdominal hysterectomy  12/2004    partial  . Cystectomy  May 2011    Roof of mouth  . US transvaginal pelvic modified  12/2004    Fibroids  . Arm fracture     Bilateral   History  Substance Use Topics  . Smoking status: Current Every Day Smoker -- 1.00 packs/day    Types: Cigarettes  . Smokeless tobacco: Never Used  . Alcohol Use: Yes     Comment: rarely   Family History  Problem Relation Age of Onset  . COPD Mother   . Cancer Father     Lung CA, smoker   Allergies  Allergen Reactions  . Amoxicillin Nausea Only  . Augmentin [Amoxicillin-Pot Clavulanate] Nausea Only  . Pseudoephedrine     REACTION: Heart races   Current Outpatient Prescriptions on File Prior to Visit  Medication Sig Dispense Refill  . ALPRAZolam (XANAX) 0.5 MG tablet TAKE 1/2 TO 1 TABLET BY MOUTH AT BEDTIME AS NEEDED 30 tablet 0  . busPIRone (BUSPAR) 15 MG tablet Take 0.5 tablets (7.5 mg total) by mouth 2 (two) times daily as needed. Take 1/2 tablet two times a day for anxiety as needed. 30 tablet 5  . ibuprofen (ADVIL,MOTRIN) 200 MG tablet Take 200 mg by mouth every 6 (six) hours as needed.      . loratadine-pseudoephedrine (CLARITIN-D 24-HOUR) 10-240 MG per 24 hr  tablet TAKE 1 TABLET BY MOUTH DAILY. 30 tablet 0  . meloxicam (MOBIC) 15 MG tablet TAKE 1 TABLET BY MOUTH EVERY DAY WITH FOOD 90 tablet 0  . Multiple Vitamin (MULTIVITAMIN) tablet Take 1 tablet by mouth daily.      . mupirocin (BACTROBAN) 2 % ointment Apply 1 application topically daily.       No current facility-administered medications on file prior to visit.      Review of Systems Review of Systems  Constitutional: Negative for fever, appetite change, fatigue and unexpected weight change.  Eyes: Negative for pain and visual disturbance.  ENT pos for rhinorrhea and sneezing and congestion  Respiratory: Negative for cough and shortness of breath.   Cardiovascular: Negative for cp or palpitations    Gastrointestinal: Negative for nausea, diarrhea and constipation.  Genitourinary: Negative for urgency and frequency.  Skin: Negative for pallor or rash   MSK pos for knee pain  Neurological: Negative  for weakness, light-headedness, numbness and headaches.  Hematological: Negative for adenopathy. Does not bruise/bleed easily.  Psychiatric/Behavioral: Negative for dysphoric mood. The patient is nervous/anxious.  pos for significant stressors        Objective:   Physical Exam  Constitutional: She appears well-developed and well-nourished. No distress.  overwt and well app  HENT:  Head: Normocephalic and atraumatic.  Right Ear: External ear normal.  Left Ear: External ear normal.  Mouth/Throat: Oropharynx is clear and moist.  Nares are boggy  Some clear rhinorrhea   Eyes: Conjunctivae and EOM are normal. Pupils are equal, round, and reactive to light. Right eye exhibits no discharge. Left eye exhibits no discharge. No scleral icterus.  Neck: Normal range of motion. Neck supple. No JVD present. No thyromegaly present.  Cardiovascular: Normal rate, regular rhythm, normal heart sounds and intact distal pulses.  Exam reveals no gallop.   Pulmonary/Chest: Effort normal and breath sounds normal. No respiratory distress. She has no wheezes. She has no rales.  Diffusely distant bs   Abdominal: Soft. Bowel sounds are normal. She exhibits no distension and no mass. There is no tenderness.  Musculoskeletal: She exhibits tenderness. She exhibits no edema.       Left knee: She exhibits decreased range of motion. She exhibits no swelling, no effusion, no ecchymosis, no deformity, normal alignment, no LCL laxity, normal patellar mobility, no bony tenderness, normal meniscus and no MCL laxity. Tenderness found. Medial joint line tenderness noted.  L knee-some tenderness post and medial  Limited full ext due to pain Nl gait Not unstable   Lymphadenopathy:    She has no cervical adenopathy.  Neurological: She is alert. She has normal reflexes. No cranial nerve deficit. She exhibits normal muscle tone. Coordination normal.  Skin: Skin is warm and dry. No rash noted. No erythema. No pallor.    Psychiatric: Her speech is normal and behavior is normal. Thought content normal. Her mood appears anxious.  Mildly anxious and stressed appearing           Assessment & Plan:   Problem List Items Addressed This Visit      Respiratory   Allergic rhinitis - Primary    Refilled claritin D  No side eff Does not raise her bp Disc avoidance of allergens       Other   Anxiety state    A lot of stressors Reviewed stressors/ coping techniques/symptoms/ support sources/ tx options and side effects in detail today  Refilled buspar     Relevant Medications  busPIRone (BUSPAR) tablet   Left knee pain    Suspect overuse/strain  Elevate and ice  mobic Consider addnl w/u if no improved     TOBACCO ABUSE    Disc in detail risks of smoking and possible outcomes including copd, vascular/ heart disease, cancer , respiratory and sinus infections  Pt voices understanding  Not ready to quit yet

## 2014-01-15 NOTE — Progress Notes (Signed)
Pre visit review using our clinic review tool, if applicable. No additional management support is needed unless otherwise documented below in the visit note. 

## 2014-01-19 NOTE — Assessment & Plan Note (Signed)
Refilled claritin D  No side eff Does not raise her bp Disc avoidance of allergens

## 2014-01-19 NOTE — Assessment & Plan Note (Signed)
A lot of stressors Reviewed stressors/ coping techniques/symptoms/ support sources/ tx options and side effects in detail today  Refilled buspar

## 2014-01-19 NOTE — Assessment & Plan Note (Signed)
Suspect overuse/strain  Elevate and ice  mobic Consider addnl w/u if no improved

## 2014-01-19 NOTE — Assessment & Plan Note (Signed)
Disc in detail risks of smoking and possible outcomes including copd, vascular/ heart disease, cancer , respiratory and sinus infections  Pt voices understanding  Not ready to quit yet 

## 2014-02-02 ENCOUNTER — Other Ambulatory Visit: Payer: Self-pay | Admitting: Family Medicine

## 2014-02-28 ENCOUNTER — Other Ambulatory Visit: Payer: Self-pay | Admitting: Family Medicine

## 2014-05-30 ENCOUNTER — Other Ambulatory Visit: Payer: Self-pay | Admitting: Family Medicine

## 2014-05-30 NOTE — Telephone Encounter (Signed)
Please refill for 6 mo 

## 2014-05-30 NOTE — Telephone Encounter (Signed)
Electronic refill request, last appt was 01/15/14 last refill was 02/28/14 #90 with 0 refills

## 2014-05-30 NOTE — Telephone Encounter (Signed)
done

## 2014-08-23 ENCOUNTER — Other Ambulatory Visit: Payer: Self-pay | Admitting: Family Medicine

## 2014-10-24 ENCOUNTER — Other Ambulatory Visit: Payer: Self-pay | Admitting: Family Medicine

## 2014-11-30 ENCOUNTER — Other Ambulatory Visit: Payer: Self-pay | Admitting: Family Medicine

## 2014-12-01 NOTE — Telephone Encounter (Signed)
done

## 2014-12-01 NOTE — Telephone Encounter (Addendum)
Electronic refill request last OV was 01/14/14, last refill on 05/30/14 #90 with 1 additional refills, please advise

## 2014-12-01 NOTE — Telephone Encounter (Signed)
Please refill until seen  

## 2014-12-17 ENCOUNTER — Other Ambulatory Visit: Payer: Self-pay

## 2014-12-17 DIAGNOSIS — Z1231 Encounter for screening mammogram for malignant neoplasm of breast: Secondary | ICD-10-CM

## 2015-01-06 ENCOUNTER — Ambulatory Visit
Admission: RE | Admit: 2015-01-06 | Discharge: 2015-01-06 | Disposition: A | Discharge status: Home / Self Care | Payer: PRIVATE HEALTH INSURANCE | Source: Ambulatory Visit

## 2015-01-06 DIAGNOSIS — Z1231 Encounter for screening mammogram for malignant neoplasm of breast: Secondary | ICD-10-CM

## 2015-01-06 LAB — HM MAMMOGRAPHY: HM Mammogram: NORMAL

## 2015-01-07 ENCOUNTER — Encounter: Payer: Self-pay | Admitting: *Deleted

## 2015-01-07 ENCOUNTER — Telehealth: Payer: Self-pay | Admitting: Family Medicine

## 2015-01-07 ENCOUNTER — Encounter: Payer: Self-pay | Admitting: Family Medicine

## 2015-01-07 DIAGNOSIS — Z0000 Encounter for general adult medical examination without abnormal findings: Secondary | ICD-10-CM

## 2015-01-07 DIAGNOSIS — E785 Hyperlipidemia, unspecified: Secondary | ICD-10-CM

## 2015-01-07 DIAGNOSIS — M25562 Pain in left knee: Secondary | ICD-10-CM

## 2015-01-07 NOTE — Telephone Encounter (Signed)
-----   Message from Marchia Bond sent at 01/02/2015 10:58 AM EST ----- Regarding: Cpx labs Thurs 12/8, need orders. Thanks! :-) Please order  future cpx labs for pt's upcoming lab appt. Thanks Aniceto Boss

## 2015-01-08 ENCOUNTER — Other Ambulatory Visit (INDEPENDENT_AMBULATORY_CARE_PROVIDER_SITE_OTHER): Payer: PRIVATE HEALTH INSURANCE

## 2015-01-08 DIAGNOSIS — R7989 Other specified abnormal findings of blood chemistry: Secondary | ICD-10-CM

## 2015-01-08 DIAGNOSIS — Z0000 Encounter for general adult medical examination without abnormal findings: Secondary | ICD-10-CM | POA: Diagnosis not present

## 2015-01-08 LAB — COMPREHENSIVE METABOLIC PANEL
ALT: 19 U/L (ref 0–35)
AST: 18 U/L (ref 0–37)
Albumin: 4.2 g/dL (ref 3.5–5.2)
Alkaline Phosphatase: 87 U/L (ref 39–117)
BILIRUBIN TOTAL: 0.4 mg/dL (ref 0.2–1.2)
BUN: 9 mg/dL (ref 6–23)
CO2: 29 meq/L (ref 19–32)
Calcium: 10.2 mg/dL (ref 8.4–10.5)
Chloride: 105 mEq/L (ref 96–112)
Creatinine, Ser: 0.95 mg/dL (ref 0.40–1.20)
GFR: 65.46 mL/min (ref >60.00)
GLUCOSE: 98 mg/dL (ref 70–99)
Potassium: 4 mEq/L (ref 3.5–5.1)
SODIUM: 140 meq/L (ref 135–145)
TOTAL PROTEIN: 6.8 g/dL (ref 6.0–8.3)

## 2015-01-08 LAB — LIPID PANEL
Cholesterol: 205 mg/dL — ABNORMAL HIGH (ref 0–200)
HDL: 38.7 mg/dL — ABNORMAL LOW (ref >39.00)
NonHDL: 166.7
Total CHOL/HDL Ratio: 5
Triglycerides: 227 mg/dL — ABNORMAL HIGH (ref 0.0–149.0)
VLDL: 45.4 mg/dL — ABNORMAL HIGH (ref 0.0–40.0)

## 2015-01-08 LAB — CBC WITH DIFFERENTIAL/PLATELET
BASOS ABS: 0.1 10*3/uL (ref 0.0–0.1)
Basophils Relative: 0.6 % (ref 0.0–3.0)
EOS ABS: 0.3 10*3/uL (ref 0.0–0.7)
Eosinophils Relative: 4 % (ref 0.0–5.0)
HCT: 39.3 % (ref 36.0–46.0)
Hemoglobin: 13.4 g/dL (ref 12.0–15.0)
LYMPHS ABS: 2.1 10*3/uL (ref 0.7–4.0)
Lymphocytes Relative: 26.4 % (ref 12.0–46.0)
MCHC: 34.1 g/dL (ref 30.0–36.0)
MCV: 91 fl (ref 78.0–100.0)
Monocytes Absolute: 0.8 10*3/uL (ref 0.1–1.0)
Monocytes Relative: 9.8 % (ref 3.0–12.0)
NEUTROS ABS: 4.8 10*3/uL (ref 1.4–7.7)
Neutrophils Relative %: 59.2 % (ref 43.0–77.0)
PLATELETS: 357 10*3/uL (ref 150.0–400.0)
RBC: 4.32 Mil/uL (ref 3.87–5.11)
RDW: 12.9 % (ref 11.5–15.5)
WBC: 8.1 10*3/uL (ref 4.0–10.5)

## 2015-01-08 LAB — LDL CHOLESTEROL, DIRECT: LDL DIRECT: 124 mg/dL

## 2015-01-08 LAB — TSH: TSH: 2.01 u[IU]/mL (ref 0.35–4.50)

## 2015-01-14 ENCOUNTER — Encounter: Payer: Self-pay | Admitting: Family Medicine

## 2015-01-14 ENCOUNTER — Ambulatory Visit (INDEPENDENT_AMBULATORY_CARE_PROVIDER_SITE_OTHER): Payer: PRIVATE HEALTH INSURANCE | Admitting: Family Medicine

## 2015-01-14 VITALS — BP 128/86 | HR 90 | Temp 98.1°F | Ht 59.5 in | Wt 146.5 lb

## 2015-01-14 DIAGNOSIS — E785 Hyperlipidemia, unspecified: Secondary | ICD-10-CM | POA: Diagnosis not present

## 2015-01-14 DIAGNOSIS — Z0000 Encounter for general adult medical examination without abnormal findings: Secondary | ICD-10-CM | POA: Diagnosis not present

## 2015-01-14 DIAGNOSIS — M810 Age-related osteoporosis without current pathological fracture: Secondary | ICD-10-CM

## 2015-01-14 DIAGNOSIS — Z1211 Encounter for screening for malignant neoplasm of colon: Secondary | ICD-10-CM | POA: Diagnosis not present

## 2015-01-14 DIAGNOSIS — Z23 Encounter for immunization: Secondary | ICD-10-CM | POA: Diagnosis not present

## 2015-01-14 DIAGNOSIS — F17200 Nicotine dependence, unspecified, uncomplicated: Secondary | ICD-10-CM | POA: Diagnosis not present

## 2015-01-14 MED ORDER — ALPRAZOLAM 0.5 MG PO TABS: 0.2500 mg | ORAL_TABLET | Freq: Every evening | ORAL | Status: DC | PRN

## 2015-01-14 MED ORDER — BUSPIRONE HCL 15 MG PO TABS: 7.5000 mg | ORAL_TABLET | Freq: Two times a day (BID) | ORAL | Status: DC | PRN

## 2015-01-14 MED ORDER — MELOXICAM 15 MG PO TABS: 15.0000 mg | ORAL_TABLET | Freq: Every day | ORAL | Status: DC

## 2015-01-14 NOTE — Patient Instructions (Signed)
Please do the IFOB stool kit for colon cancer screening  When you are ready to get a colonoscopy or a bone density test please let us know  Flu shot today  Keep thinking about quitting smoking For cholesterol -- Avoid red meat/ fried foods/ egg yolks/ fatty breakfast meats/ butter, cheese and high fat dairy/ and shellfish   Fit in exercise when you can

## 2015-01-14 NOTE — Progress Notes (Signed)
Pre visit review using our clinic review tool, if applicable. No additional management support is needed unless otherwise documented below in the visit note. 

## 2015-01-14 NOTE — Progress Notes (Signed)
Subjective:    Patient ID: [name], female    DOB: July 02, 1960, 54 y.o.   MRN: [id number]  HPI Here for health maintenance exam and to review chronic medical problems    Doing well   Sinus pressure this am  A bit congested and some sneezing  A little bit of bleeding from nose   Wt is up 7 lb with bmi of 29 No exercise -no time due to work schedule  Diet is fair - does not have regular meals due to work schedule  Has to stick with her job   Hep C/ HIV screening - declines /not high risk   Colonoscopy-declines  IFOB net in 11/14 Wants to do the stool kit this year -may consider colonoscopy next year  No family hx   Flu shot -had today   Mammogram 12/16 (sister with breast cancer)  Self exam - no lumps or changes   Has had a hysterectomy - not for cancer /had fibroid   Td 1/13 Pneumovax 10/11  dexa 12/14- osteopenia - at the [facility]  Taking her ca and D  No falls or fractures  She wants to hold off a little longer on the dexa (a lot of dental work to do thiw work)    Smoking status- about a pack per day  Not planning to quit yet -would like to in the future (not ready) Worse with stress  No breathing problems    Hyperlipidemia Lab Results  Component Value Date   CHOL 205* 01/08/2015   CHOL 218* 12/13/2012   CHOL 189 11/04/2011   Lab Results  Component Value Date   HDL 38.70* 01/08/2015   HDL 52.90 12/13/2012   HDL 35.60* 11/04/2011   Lab Results  Component Value Date   LDLCALC 129* 11/02/2009   Lab Results  Component Value Date   TRIG 227.0* 01/08/2015   TRIG 96.0 12/13/2012   TRIG 226.0* 11/04/2011   Lab Results  Component Value Date   CHOLHDL 5 01/08/2015   CHOLHDL 4 12/13/2012   CHOLHDL 5 11/04/2011   Lab Results  Component Value Date   LDLDIRECT 124.0 01/08/2015   LDLDIRECT 150.0 12/13/2012   LDLDIRECT 114.5 11/04/2011    HDL is down - ? Due to no exercise or menopause LDL is down from 150 to 124  She usually stays away  from fatty foods and fast food   Results for orders placed or performed in visit on 01/08/15  CBC with Differential/Platelet  Result Value Ref Range   WBC 8.1 4.0 - 10.5 K/uL   RBC 4.32 3.87 - 5.11 Mil/uL   Hemoglobin 13.4 12.0 - 15.0 g/dL   HCT 39.3 36.0 - 46.0 %   MCV 91.0 78.0 - 100.0 fl   MCHC 34.1 30.0 - 36.0 g/dL   RDW 12.9 11.5 - 15.5 %   Platelets 357.0 150.0 - 400.0 K/uL   Neutrophils Relative % 59.2 43.0 - 77.0 %   Lymphocytes Relative 26.4 12.0 - 46.0 %   Monocytes Relative 9.8 3.0 - 12.0 %   Eosinophils Relative 4.0 0.0 - 5.0 %   Basophils Relative 0.6 0.0 - 3.0 %   Neutro Abs 4.8 1.4 - 7.7 K/uL   Lymphs Abs 2.1 0.7 - 4.0 K/uL   Monocytes Absolute 0.8 0.1 - 1.0 K/uL   Eosinophils Absolute 0.3 0.0 - 0.7 K/uL   Basophils Absolute 0.1 0.0 - 0.1 K/uL  Comprehensive metabolic panel  Result Value Ref Range   Sodium  140 135 - 145 mEq/L   Potassium 4.0 3.5 - 5.1 mEq/L   Chloride 105 96 - 112 mEq/L   CO2 29 19 - 32 mEq/L   Glucose, Bld 98 70 - 99 mg/dL   BUN 9 6 - 23 mg/dL   Creatinine, Ser 0.95 0.40 - 1.20 mg/dL   Total Bilirubin 0.4 0.2 - 1.2 mg/dL   Alkaline Phosphatase 87 39 - 117 U/L   AST 18 0 - 37 U/L   ALT 19 0 - 35 U/L   Total Protein 6.8 6.0 - 8.3 g/dL   Albumin 4.2 3.5 - 5.2 g/dL   Calcium 10.2 8.4 - 10.5 mg/dL   GFR 65.46 >60.00 mL/min  Lipid panel  Result Value Ref Range   Cholesterol 205 (H) 0 - 200 mg/dL   Triglycerides 227.0 (H) 0.0 - 149.0 mg/dL   HDL 38.70 (L) >39.00 mg/dL   VLDL 45.4 (H) 0.0 - 40.0 mg/dL   Total CHOL/HDL Ratio 5    NonHDL 166.70   TSH  Result Value Ref Range   TSH 2.01 0.35 - 4.50 uIU/mL  LDL cholesterol, direct  Result Value Ref Range   Direct LDL 124.0 mg/dL    Anxiety- is about the same She needs refills on buspar and xanax   Patient Active Problem List   Diagnosis Date Noted  . Left knee pain 01/15/2014  . Acute sinus infection 01/18/2013  . Neoplasm of skin 12/28/2012  . Osteoporosis 12/17/2012  . Colon cancer  screening 12/17/2012  . Hyperlipidemia 12/17/2012  . Routine general medical examination at a health care facility 11/04/2011  . CARPAL TUNNEL SYNDROME 11/22/2006  . Anxiety state 11/21/2006  . Allergic rhinitis 11/21/2006  . ASTHMA 11/21/2006  . 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 TOBACCO ABUSE 09/13/2006   Past Medical History  Diagnosis Date  . Allergy   . Anxiety   . Asthma   . Osteoporosis   . Psoriasis   . Carpal tunnel syndrome   . Tobacco abuse   . Viral meningitis    Past Surgical History  Procedure Laterality Date  . Abdominal hysterectomy  12/2004    partial  . Cystectomy  May 2011    Roof of mouth  . US transvaginal pelvic modified  12/2004    Fibroids  . Arm fracture      Bilateral   Social History  Substance Use Topics  . Smoking status: Current Every Day Smoker -- 1.00 packs/day    Types: Cigarettes  . Smokeless tobacco: Never Used  . Alcohol Use: 0.0 oz/week    0 Standard drinks or equivalent per week     Comment: rarely   Family History  Problem Relation Age of Onset  . COPD Mother   . Cancer Father     Lung CA, smoker   Allergies  Allergen Reactions  . Amoxicillin Nausea Only  . Augmentin [Amoxicillin-Pot Clavulanate] Nausea Only  . Pseudoephedrine     REACTION: Heart races   Current Outpatient Prescriptions on File Prior to Visit  Medication Sig Dispense Refill  . ALPRAZolam (XANAX) 0.5 MG tablet TAKE 1/2 TO 1 TABLET BY MOUTH AT BEDTIME AS NEEDED 30 tablet 0  . busPIRone (BUSPAR) 15 MG tablet Take 0.5 tablets (7.5 mg total) by mouth 2 (two) times daily as needed. Take 1/2 tablet two times a day for anxiety as needed. 30 tablet 5  . ibuprofen (ADVIL,MOTRIN) 200 MG tablet Take 200 mg by mouth every 6 (six) hours as needed.      Marland Kitchen  loratadine-pseudoephedrine (CLARITIN-D 24-HOUR) 10-240 MG per 24 hr tablet Take 1 tablet by mouth daily. 30 tablet 11  . meloxicam (MOBIC) 15 MG tablet TAKE 1 TABLET BY MOUTH EVERY DAY WITH FOOD 90 tablet 0  .  Multiple Vitamin (MULTIVITAMIN) tablet Take 1 tablet by mouth daily.      . mupirocin (BACTROBAN) 2 % ointment Apply 1 application topically daily.       No current facility-administered medications on file prior to visit.    Review of Systems Review of Systems  Constitutional: Negative for fever, appetite change, fatigue and unexpected weight change.  ENT pos for sinus congestion and post nasal drip /neg for sinus pain  Eyes: Negative for pain and visual disturbance.  Respiratory: Negative for wheeze and shortness of breath.   Cardiovascular: Negative for cp or palpitations    Gastrointestinal: Negative for nausea, diarrhea and constipation.  Genitourinary: Negative for urgency and frequency.  Skin: Negative for pallor or rash   Neurological: Negative for weakness, light-headedness, numbness and headaches.  Hematological: Negative for adenopathy. Does not bruise/bleed easily.  Psychiatric/Behavioral: Negative for dysphoric mood. The patient is not nervous/anxious.         Objective:   Physical Exam  Constitutional: She appears well-developed and well-nourished. No distress.  overwt and well app  HENT:  Head: Normocephalic and atraumatic.  Right Ear: External ear normal.  Left Ear: External ear normal.  Mouth/Throat: Oropharynx is clear and moist.  Nares are boggy and injected No sinus tenderness  Eyes: Conjunctivae and EOM are normal. Pupils are equal, round, and reactive to light. No scleral icterus.  Neck: Normal range of motion. Neck supple. No JVD present. Carotid bruit is not present. No thyromegaly present.  Cardiovascular: Normal rate, regular rhythm, normal heart sounds and intact distal pulses.  Exam reveals no gallop.   Pulmonary/Chest: Effort normal and breath sounds normal. No respiratory distress. She has no wheezes. She exhibits no tenderness.  Diffusely distant bs  No wheezing   Abdominal: Soft. Bowel sounds are normal. She exhibits no distension, no abdominal  bruit and no mass. There is no tenderness.  Genitourinary: No breast swelling, tenderness, discharge or bleeding.  Musculoskeletal: Normal range of motion. She exhibits no edema or tenderness.  Lymphadenopathy:    She has no cervical adenopathy.  Neurological: She is alert. She has normal reflexes. No cranial nerve deficit. She exhibits normal muscle tone. Coordination normal.  Skin: Skin is warm and dry. No rash noted. No erythema. No pallor.  Psychiatric: She has a normal mood and affect.          Assessment & Plan:   Problem List Items Addressed This Visit      Musculoskeletal and Integument   Osteoporosis    Reviewed last dexa -osteopenia (in a smoker) No falls or fractures  Due for dexa- she declined to schedule it yet  On ca and D  Disc need for calcium/ vitamin D/ wt bearing exercise and bone density test every 2 y to monitor Disc safety/ fracture risk in detail   Enc to quit smoking         Other   Colon cancer screening    Pt not ready to schedule colonoscopy yet  Given ifob kit for screening  No symptoms       Relevant Orders   Fecal occult blood, imunochemical   Hyperlipidemia    Disc goals for lipids and reasons to control them Rev labs with pt Rev low sat fat diet in detail  Dec HDL and LDL- ? If menopause related Continue to follow      Routine general medical examination at a health care facility - Primary    Reviewed health habits including diet and exercise and skin cancer prevention Reviewed appropriate screening tests for age  Also reviewed health mt list, fam hx and immunization status , as well as social and family history   See HPI Labs reviewed  Please do the IFOB stool kit for colon cancer screening  When you are ready to get a colonoscopy or a bone density test please let us know  Flu shot today  Keep thinking about quitting smoking For cholesterol -- Avoid red meat/ fried foods/ egg yolks/ fatty breakfast meats/ butter, cheese and  high fat dairy/ and shellfish   Fit in exercise when you can       TOBACCO ABUSE    Disc in detail risks of smoking and possible outcomes including copd, vascular/ heart disease, cancer , respiratory and sinus infections  Pt voices understanding  She states she is not ready to quit at this time        Other Visit Diagnoses    Need for influenza vaccination        Relevant Orders    Flu Vaccine QUAD 36+ mos PF IM (Fluarix & Fluzone Quad PF) (Completed)

## 2015-01-15 ENCOUNTER — Other Ambulatory Visit: Payer: Self-pay | Admitting: Family Medicine

## 2015-01-15 NOTE — Assessment & Plan Note (Signed)
Disc in detail risks of smoking and possible outcomes including copd, vascular/ heart disease, cancer , respiratory and sinus infections  Pt voices understanding  She states she is not ready to quit at this time

## 2015-01-15 NOTE — Assessment & Plan Note (Signed)
Pt not ready to schedule colonoscopy yet  Given ifob kit for screening  No symptoms

## 2015-01-15 NOTE — Assessment & Plan Note (Signed)
Reviewed last dexa -osteopenia (in a smoker) No falls or fractures  Due for dexa- she declined to schedule it yet  On ca and D  Disc need for calcium/ vitamin D/ wt bearing exercise and bone density test every 2 y to monitor Disc safety/ fracture risk in detail   Enc to quit smoking

## 2015-01-15 NOTE — Assessment & Plan Note (Signed)
Disc goals for lipids and reasons to control them Rev labs with pt Rev low sat fat diet in detail  Dec HDL and LDL- ? If menopause related Continue to follow

## 2015-01-15 NOTE — Assessment & Plan Note (Signed)
Reviewed health habits including diet and exercise and skin cancer prevention Reviewed appropriate screening tests for age  Also reviewed health mt list, fam hx and immunization status , as well as social and family history   See HPI Labs reviewed  Please do the IFOB stool kit for colon cancer screening  When you are ready to get a colonoscopy or a bone density test please let us know  Flu shot today  Keep thinking about quitting smoking For cholesterol -- Avoid red meat/ fried foods/ egg yolks/ fatty breakfast meats/ butter, cheese and high fat dairy/ and shellfish   Fit in exercise when you can

## 2016-01-13 ENCOUNTER — Other Ambulatory Visit: Payer: Self-pay | Admitting: Family Medicine

## 2016-01-13 NOTE — Telephone Encounter (Signed)
Appt scheduled for next week because pt may run out of insurance next year. buspar declined pt said she didn't need that Rx filled and loratadine filled

## 2016-01-13 NOTE — Telephone Encounter (Signed)
Pt hasn't been seen in over a year and no future appts., please advise  

## 2016-01-13 NOTE — Telephone Encounter (Signed)
Please schedule late winter f/u and refill until then

## 2016-01-20 ENCOUNTER — Encounter: Payer: Self-pay | Admitting: Family Medicine

## 2016-01-20 ENCOUNTER — Ambulatory Visit (INDEPENDENT_AMBULATORY_CARE_PROVIDER_SITE_OTHER): Payer: PRIVATE HEALTH INSURANCE | Admitting: Family Medicine

## 2016-01-20 VITALS — BP 134/82 | HR 86 | Temp 97.3°F | Ht 59.5 in | Wt 145.5 lb

## 2016-01-20 DIAGNOSIS — F411 Generalized anxiety disorder: Secondary | ICD-10-CM | POA: Diagnosis not present

## 2016-01-20 DIAGNOSIS — F17200 Nicotine dependence, unspecified, uncomplicated: Secondary | ICD-10-CM | POA: Diagnosis not present

## 2016-01-20 DIAGNOSIS — Z23 Encounter for immunization: Secondary | ICD-10-CM

## 2016-01-20 DIAGNOSIS — G5621 Lesion of ulnar nerve, right upper limb: Secondary | ICD-10-CM

## 2016-01-20 MED ORDER — ALPRAZOLAM 0.5 MG PO TABS: 0.2500 mg | ORAL_TABLET | Freq: Every evening | ORAL | 0 refills | Status: DC | PRN

## 2016-01-20 MED ORDER — MUPIROCIN 2 % EX OINT: 1.0000 "application " | TOPICAL_OINTMENT | Freq: Every day | CUTANEOUS | 2 refills | Status: DC

## 2016-01-20 NOTE — Assessment & Plan Note (Signed)
Disc in detail risks of smoking and possible outcomes including copd, vascular/ heart disease, cancer , respiratory and sinus infections  Pt voices understanding Not ready to quit yet  Flu shot today

## 2016-01-20 NOTE — Progress Notes (Signed)
Pre visit review using our clinic review tool, if applicable. No additional management support is needed unless otherwise documented below in the visit note. 

## 2016-01-20 NOTE — Progress Notes (Signed)
Subjective:    Patient ID: [name], female    DOB: Jul 21, 1960, 55 y.o.   MRN: [id number]  HPI Here for f/u of chronic medical problems   Getting ready for a grandchild in Jan  utd on Tdap  Will get a flu shot today as well   Also R hand issue  It hurts  A lot of reped movement - has made a lot of crafts for baby shower and holidays  Hand sewing a lot of things Tingling all the way up her arm - esp the 4th and 5th fingers (is uncomfortable over elbow) L handed-this is the hand she holds things with  Hurts to put her fingers together  Also to grip (has to help with her L hand)   Heat wrap on hand or shoulder or arm helps a little   She is taking meloxicam every night  Done with the sewing for now   Using her brace for carpal tunnel     Wt Readings from Last 3 Encounters:  01/20/16 145 lb 8 oz (66 kg)  01/14/15 146 lb 8 oz (66.5 kg)  01/15/14 139 lb (63 kg)   Smoking status - is about 1 ppd Stays busy so not to smoke when possible  Not ready to quit yet- has thought about it    Mood- on buspar  Stressor- 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 Left knee pain 01/15/2014  . Neoplasm of skin 12/28/2012  . Osteoporosis 12/17/2012  . Colon cancer screening 12/17/2012  . Hyperlipidemia 12/17/2012  . Routine general medical examination at a health care facility 11/04/2011  . CARPAL TUNNEL SYNDROME 11/22/2006  . Generalized anxiety disorder 11/21/2006  . Allergic rhinitis 11/21/2006  . ASTHMA 11/21/2006  . PSORIASIS, PUSTULAR  11/21/2006  . TOBACCO ABUSE 09/13/2006   Past Medical History:  Diagnosis Date  . Allergy   . Anxiety   . Asthma   . Carpal tunnel syndrome   . Osteoporosis   . Psoriasis   . Tobacco abuse   . Viral meningitis    Past Surgical History:  Procedure Laterality Date  . ABDOMINAL HYSTERECTOMY  12/2004   partial  . Arm fracture     Bilateral  . CYSTECTOMY  May 2011   Roof of mouth  . US TRANSVAGINAL PELVIC MODIFIED  12/2004   Fibroids   Social History  Substance Use Topics  . Smoking status: Current Every Day Smoker    Packs/day: 1.00    Types: Cigarettes  . Smokeless tobacco: Never Used  . Alcohol use 0.0 oz/week     Comment: rarely   Family History  Problem Relation Age of Onset  . COPD Mother   . Cancer Father     Lung CA, smoker   Allergies  Allergen Reactions  . Amoxicillin Nausea Only  . Augmentin [Amoxicillin-Pot Clavulanate] Nausea Only  . Pseudoephedrine     REACTION: Heart races   Current Outpatient Prescriptions on  File Prior to Visit  Medication Sig Dispense Refill  . busPIRone (BUSPAR) 15 MG tablet Take 0.5 tablets (7.5 mg total) by mouth 2 (two) times daily as needed. Take 1/2 tablet two times a day for anxiety as needed. 30 tablet 11  . [facility] LORATADINE-D 24 HOUR 10-240 MG 24 hr tablet TAKE 1 TABLET BY MOUTH DAILY. 30 tablet 0  . meloxicam (MOBIC) 15 MG tablet Take 1 tablet (15 mg total) by mouth daily. with food 30 tablet 11  . Multiple Vitamin (MULTIVITAMIN) tablet Take 1 tablet by mouth daily.       No current facility-administered medications on file prior to visit.     Review of Systems    Review of Systems  Constitutional: Negative for fever, appetite change, fatigue and unexpected weight change.  Eyes: Negative for pain and visual disturbance.  Respiratory: Negative for cough and shortness of breath.   Cardiovascular: Negative for cp or palpitations    Gastrointestinal: Negative for nausea, diarrhea and constipation.  Genitourinary:  Negative for urgency and frequency.  Skin: Negative for pallor or rash   Neurological: Negative for weakness, light-headedness,  and headaches. pos for tingling in R hand  MSK pos for R hand and arm pain  Hematological: Negative for adenopathy. Does not bruise/bleed easily.  Psychiatric/Behavioral: Negative for dysphoric mood. The patient is nervous/anxious.      Objective:   Physical Exam  Constitutional: She appears well-developed and well-nourished. No distress.  Well appearing   HENT:  Head: Normocephalic and atraumatic.  Mouth/Throat: Oropharynx is clear and moist.  Eyes: Conjunctivae and EOM are normal. Pupils are equal, round, and reactive to light.  Neck: Normal range of motion. Neck supple. No JVD present. Carotid bruit is not present. No thyromegaly present.  Cardiovascular: Normal rate, regular rhythm, normal heart sounds and intact distal pulses.  Exam reveals no gallop.   Pulmonary/Chest: Effort normal and breath sounds normal. No respiratory distress. She has no wheezes. She has no rales.  No crackles  Diffusely distant bs  No wheeze  Abdominal: Soft. Bowel sounds are normal. She exhibits no distension, no abdominal bruit and no mass. There is no tenderness.  Musculoskeletal: She exhibits no edema.  Nl rom of neck and elbow w/o tenderness   Lymphadenopathy:    She has no cervical adenopathy.  Neurological: She is alert. She has normal reflexes. She displays no atrophy and no tremor. A sensory deficit is present. No cranial nerve deficit. She exhibits normal muscle tone. Coordination normal.  R hand Pos tinel and phalen for tingling in 4th and 5th fingers  Nl rom and perf  Dec sens to lt touch 4,5th digits No tenderness of hand/wrist /elbow or neck   Deg grip due to discomfort    Skin: Skin is warm and dry. No rash noted. No pallor.  Psychiatric: She has a normal mood and affect.          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    Suspect at level of wrist from excessive sewing and crafting  inst to take meloxicam, wear wrist splint (24 hours for 1 wk then just at night) Break from the crafts If no improvement in 1-2 weeks- update and will ref to neuro  Update earlier if worse       TOBACCO ABUSE - Primary    Disc in detail risks of smoking and  possible outcomes including copd, vascular/ heart disease, cancer , respiratory and sinus infections  Pt voices understanding Not ready to quit yet  Flu shot today      Generalized anxiety disorder    Stressful time Reviewed stressors/ coping techniques/symptoms/ support sources/ tx options and side effects in detail today  Will inc buspar to bid  Refilled xanax for prn use with caution        Other Visit Diagnoses    Need for influenza vaccination       Relevant Orders   Flu Vaccine QUAD 36+ mos IM (Completed)

## 2016-01-20 NOTE — Patient Instructions (Addendum)
I think you have some ulnar nerve irritation in your right hand due to all of the crafts and sewing you have done recently  Wear the wrist splint as much as you can - for 1 week and then just at night  Avoid repetitive tasks  Take the meloxicam  If no improvement in 1-2 weeks with the hand - let us know (to set you up with neurology for nerve tests)   Flu shot today  Keep thinking about quitting smoking   I think you should take the buspar twice daily (you can increase it to a whole pill twice daily if needed)

## 2016-01-20 NOTE — Assessment & Plan Note (Signed)
Stressful time Reviewed stressors/ coping techniques/symptoms/ support sources/ tx options and side effects in detail today  Will inc buspar to bid  Refilled xanax for prn use with caution

## 2016-01-20 NOTE — Assessment & Plan Note (Signed)
Suspect at level of wrist from excessive sewing and crafting  inst to take meloxicam, wear wrist splint (24 hours for 1 wk then just at night) Break from the crafts If no improvement in 1-2 weeks- update and will ref to neuro  Update earlier if worse

## 2016-02-16 ENCOUNTER — Other Ambulatory Visit: Payer: Self-pay | Admitting: Family Medicine

## 2016-02-16 NOTE — Telephone Encounter (Signed)
Will refill electronically  

## 2016-02-16 NOTE — Telephone Encounter (Signed)
Last OV was 01/27/16, last filled on 01/14/15 #30 with 11 additional refills, please advise

## 2017-02-01 ENCOUNTER — Ambulatory Visit (INDEPENDENT_AMBULATORY_CARE_PROVIDER_SITE_OTHER): Payer: 59 | Admitting: Family Medicine

## 2017-02-01 ENCOUNTER — Encounter: Payer: Self-pay | Admitting: Family Medicine

## 2017-02-01 DIAGNOSIS — J069 Acute upper respiratory infection, unspecified: Secondary | ICD-10-CM

## 2017-02-01 MED ORDER — DOXYCYCLINE HYCLATE 100 MG PO TABS: 100.0000 mg | ORAL_TABLET | Freq: Two times a day (BID) | ORAL | 0 refills | Status: DC

## 2017-02-01 MED ORDER — CETIRIZINE-PSEUDOEPHEDRINE ER 5-120 MG PO TB12: 1.0000 | ORAL_TABLET | Freq: Two times a day (BID) | ORAL | 0 refills | Status: DC | PRN

## 2017-02-01 MED ORDER — ALBUTEROL SULFATE HFA 108 (90 BASE) MCG/ACT IN AERS: 1.0000 | INHALATION_SPRAY | Freq: Four times a day (QID) | RESPIRATORY_TRACT | 0 refills | Status: DC | PRN

## 2017-02-01 MED ORDER — MELOXICAM 15 MG PO TABS: 15.0000 mg | ORAL_TABLET | Freq: Every day | ORAL | 0 refills | Status: DC

## 2017-02-01 NOTE — Progress Notes (Signed)
Smoking cessation encouraged.  D/w pt.    Sick contact prior to christmas.  Sick >1 week.  Cough, sputum, wheeze.  No recent SABA use.  No vomiting, no diarrhea.  Maxillary and tooth pain.  Some B, L>R, ear pain.  No fevers.    Meds, vitals, and allergies reviewed.   ROS: Per HPI unless specifically indicated in ROS section   GEN: nad, alert and oriented HEENT: mucous membranes moist, tm w/o erythema, nasal exam w/o erythema, clear discharge noted,  OP with cobblestoning, max sinuses not ttp B at the time of the exam.  NECK: supple w/o LA CV: rrr.   PULM: ctab, no inc wob EXT: no edema

## 2017-02-01 NOTE — Patient Instructions (Signed)
Try changing to zyrtec D.  Update [facility] about that and the future meloxicam refills.  Start doxy and use the inhaler if needed.  Take care.  Glad to see you.  Update [place] as needed.

## 2017-02-02 DIAGNOSIS — J069 Acute upper respiratory infection, unspecified: Secondary | ICD-10-CM | POA: Insufficient documentation

## 2017-02-02 NOTE — Assessment & Plan Note (Signed)
She has been able to tolerate the current dose of pseudoephedrine.  Discussed with patient.  She has occasionally had to rotate her antihistamines, okay to try changing to Zyrtec D. Start doxy and use the inhaler if needed. Routine cautions given.   Update [place] as needed.  She agrees.  I sent a short-term refill of her meloxicam and Zyrtec-D.  I will defer further refills to her PCP.

## 2017-04-07 ENCOUNTER — Encounter: Payer: Self-pay | Admitting: Neurology

## 2017-04-07 ENCOUNTER — Ambulatory Visit (INDEPENDENT_AMBULATORY_CARE_PROVIDER_SITE_OTHER): Payer: 59 | Admitting: Family Medicine

## 2017-04-07 ENCOUNTER — Encounter: Payer: Self-pay | Admitting: Internal Medicine

## 2017-04-07 ENCOUNTER — Ambulatory Visit: Payer: 59 | Admitting: Family Medicine

## 2017-04-07 ENCOUNTER — Encounter: Payer: Self-pay | Admitting: Family Medicine

## 2017-04-07 VITALS — BP 116/68 | HR 88 | Temp 98.4°F | Ht 59.5 in | Wt 145.8 lb

## 2017-04-07 DIAGNOSIS — Z1211 Encounter for screening for malignant neoplasm of colon: Secondary | ICD-10-CM

## 2017-04-07 DIAGNOSIS — G5601 Carpal tunnel syndrome, right upper limb: Secondary | ICD-10-CM

## 2017-04-07 DIAGNOSIS — Z23 Encounter for immunization: Secondary | ICD-10-CM | POA: Diagnosis not present

## 2017-04-07 DIAGNOSIS — F17200 Nicotine dependence, unspecified, uncomplicated: Secondary | ICD-10-CM

## 2017-04-07 DIAGNOSIS — J3089 Other allergic rhinitis: Secondary | ICD-10-CM

## 2017-04-07 DIAGNOSIS — G5621 Lesion of ulnar nerve, right upper limb: Secondary | ICD-10-CM

## 2017-04-07 MED ORDER — CETIRIZINE-PSEUDOEPHEDRINE ER 5-120 MG PO TB12: 1.0000 | ORAL_TABLET | Freq: Two times a day (BID) | ORAL | 11 refills | Status: DC | PRN

## 2017-04-07 NOTE — Progress Notes (Signed)
Subjective:    Patient ID: [name], female    DOB: [birth year], 57 y.o.   MRN: [id number]  HPI Here for f/u of chronic medical problems  Very busy  Tired from that  Works 60 hours per week  Job is stressful - just started in May   Trying to take care of herself   Saw Dr Damita Dunnings for uri in Jan  He px zyrtec D and needs a px (it works better than claritin)   Has ulnar nerve pain in her R hand / with carpal tunnel  Is improved but not going away  Hard to work on a calculator Using a wrist guard    BP Readings from Last 3 Encounters:  04/07/17 116/68  02/01/17 118/74  01/20/16 134/82     Wt Readings from Last 3 Encounters:  04/07/17 145 lb 12 oz (66.1 kg)  02/01/17 141 lb 12 oz (64.3 kg)  01/20/16 145 lb 8 oz (66 kg)  fairly stable  Eats ok overall/not a big eater  No time for exercise  28.95 kg/m   Smoking status -lately smoked about a 1ppd  Not ready to quit   Wants a flu shot   Needs to get a mammogram -breast center  No lumps on self exam   Interested in a colonoscopy  Never had one before   Patient Active Problem List   Diagnosis Date Noted  . 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 Left knee pain 01/15/2014  . Neoplasm of skin 12/28/2012  . Osteoporosis 12/17/2012  . Colon cancer screening 12/17/2012  . Hyperlipidemia 12/17/2012  . Routine general medical examination at a health care facility 11/04/2011  . CARPAL TUNNEL SYNDROME 11/22/2006  . Generalized anxiety disorder 11/21/2006  . Allergic rhinitis 11/21/2006  . ASTHMA 11/21/2006  . 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 TOBACCO ABUSE 09/13/2006   Past Medical History:  Diagnosis Date  . Allergy   . Anxiety   . Asthma   . Carpal tunnel syndrome   . Osteoporosis   . Psoriasis   . Tobacco abuse   . Viral meningitis    Past Surgical History:  Procedure Laterality Date  . ABDOMINAL HYSTERECTOMY  12/2004   partial  . Arm fracture     Bilateral  . CYSTECTOMY  May  2011   Roof of mouth  . US TRANSVAGINAL PELVIC MODIFIED  12/2004   Fibroids   Social History   Tobacco Use  . Smoking status: Current Every Day Smoker    Packs/day: 1.00    Types: Cigarettes  . Smokeless tobacco: Never Used  Substance Use Topics  . Alcohol use: Yes    Alcohol/week: 0.0 oz    Comment: rarely  . Drug use: No   Family History  Problem Relation Age of Onset  . COPD Mother   . Cancer Father        Lung CA, smoker   Allergies  Allergen Reactions  . Amoxicillin Nausea Only  . Augmentin [Amoxicillin-Pot Clavulanate] Nausea Only  . Pseudoephedrine     REACTION: Heart races at high doses.     Current Outpatient Medications on File Prior to Visit  Medication Sig Dispense Refill  . Calcium Carb-Cholecalciferol (CALCIUM 600 + D PO) Take 1 capsule by mouth daily.    . meloxicam (MOBIC) 15 MG tablet Take 1 tablet (15 mg total) by mouth daily. with food 30 tablet 0  . Multiple Vitamin (MULTIVITAMIN) tablet Take 1 tablet by mouth daily.      Marland Kitchen  mupirocin ointment (BACTROBAN) 2 % Apply 1 application topically daily. To affected area in nostrols 22 g 2  . OVER THE COUNTER MEDICATION Take 1 capsule by mouth daily. COGINUM     No current facility-administered medications on file prior to visit.     Review of Systems  Constitutional: Negative for activity change, appetite change, fatigue, fever and unexpected weight change.  HENT: Positive for congestion, postnasal drip, rhinorrhea and sneezing. Negative for ear pain, sinus pressure, sinus pain, sore throat and trouble swallowing.   Eyes: Negative for pain, redness, itching and visual disturbance.  Respiratory: Negative for cough, chest tightness, shortness of breath and wheezing.   Cardiovascular: Negative for chest pain and palpitations.  Gastrointestinal: Negative for abdominal pain, blood in stool, constipation, diarrhea and nausea.  Endocrine: Negative for cold intolerance, heat intolerance, polydipsia and polyuria.    Genitourinary: Negative for difficulty urinating, dysuria, frequency and urgency.  Musculoskeletal: Negative for arthralgias, joint swelling and myalgias.  Skin: Negative for pallor and rash.  Neurological: Positive for numbness. Negative for dizziness, tremors, weakness and headaches.       Tingling of R hand with some pain   Hematological: Negative for adenopathy. Does not bruise/bleed easily.  Psychiatric/Behavioral: Negative for decreased concentration and dysphoric mood. The patient is not nervous/anxious.        Objective:   Physical Exam  Constitutional: She appears well-developed and well-nourished. No distress.  overwt and well app  HENT:  Head: Normocephalic and atraumatic.  Mouth/Throat: Oropharynx is clear and moist.  Eyes: Conjunctivae and EOM are normal. Pupils are equal, round, and reactive to light. No scleral icterus.  Neck: Normal range of motion. Neck supple. Carotid bruit is not present. No thyromegaly present.  Cardiovascular: Normal rate, regular rhythm and normal heart sounds.  Pulmonary/Chest: Effort normal and breath sounds normal. No respiratory distress. She has no wheezes. She has no rales.  Diffusely distant bs   Musculoskeletal: She exhibits no edema, tenderness or deformity.  R wrist - pos tinel  Nl rom and grip with discomfort No edema or deformity  Lymphadenopathy:    She has no cervical adenopathy.  Neurological: She is alert. She has normal reflexes. No cranial nerve deficit. She exhibits normal muscle tone. Coordination normal.  R hand-pos tinel  Skin: Skin is warm and dry. No rash noted. No erythema. No pallor.  Psychiatric: She has a normal mood and affect.          Assessment & Plan:   Problem List Items Addressed This Visit      Respiratory   Allergic rhinitis - Primary    Pt continues to use zyrtec D - no bp problems with it  Px written (she can get this with ins plan)        Nervous and Auditory   CARPAL TUNNEL SYNDROME     Worsening on the R  Ref to neurology for further eval (NCV likely) and tx       Relevant Orders   Ambulatory referral to Neurology     Other   Colon cancer screening    Referral done for screening colonoscopy      Relevant Orders   Ambulatory referral to Gastroenterology   TOBACCO ABUSE    Disc in detail risks of smoking and possible outcomes including copd, vascular/ heart disease, cancer , respiratory and sinus infections  Pt voices understanding       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

## 2017-04-07 NOTE — Patient Instructions (Addendum)
Here is the phone number for the breast center- make your mammogram appointment   Think about scheduling a physical   We will work on referral for colonoscopy and also a neurology referral for your hand   Flu shot today   Take care of yourself  Think about quitting smoking

## 2017-04-09 NOTE — Assessment & Plan Note (Signed)
With carpal tunnel Ref to neurology

## 2017-04-09 NOTE — Assessment & Plan Note (Signed)
Pt continues to use zyrtec D - no bp problems with it  Px written (she can get this with ins plan)

## 2017-04-09 NOTE — Assessment & Plan Note (Signed)
Worsening on the R  Ref to neurology for further eval (NCV likely) and tx

## 2017-04-09 NOTE — Assessment & Plan Note (Signed)
Disc in detail risks of smoking and possible outcomes including copd, vascular/ heart disease, cancer , respiratory and sinus infections  Pt voices understanding  

## 2017-04-09 NOTE — Assessment & Plan Note (Signed)
Referral done for screening colonoscopy  

## 2017-04-17 ENCOUNTER — Ambulatory Visit (AMBULATORY_SURGERY_CENTER): Payer: Self-pay | Admitting: *Deleted

## 2017-04-17 ENCOUNTER — Other Ambulatory Visit: Payer: Self-pay

## 2017-04-17 VITALS — Ht 59.5 in | Wt 145.0 lb

## 2017-04-17 DIAGNOSIS — Z1211 Encounter for screening for malignant neoplasm of colon: Secondary | ICD-10-CM

## 2017-04-17 NOTE — Progress Notes (Signed)
Patient denies any allergies to eggs or soy. Patient denies any problems with anesthesia/sedation. Patient denies any oxygen use at home. Patient denies taking any diet/weight loss medications or blood thinners. EMMI education assisgned to patient on colonoscopy, this was explained and instructions given to patient. 

## 2017-05-01 ENCOUNTER — Encounter: Payer: 59 | Admitting: Internal Medicine

## 2017-06-22 ENCOUNTER — Encounter: Payer: Self-pay | Admitting: Internal Medicine

## 2017-06-29 ENCOUNTER — Encounter: Payer: Self-pay | Admitting: Internal Medicine

## 2017-06-29 ENCOUNTER — Other Ambulatory Visit: Payer: Self-pay

## 2017-06-29 ENCOUNTER — Ambulatory Visit (AMBULATORY_SURGERY_CENTER): Payer: 59 | Admitting: Internal Medicine

## 2017-06-29 VITALS — BP 134/91 | HR 84 | Temp 98.4°F | Resp 13 | Ht 59.0 in | Wt 145.0 lb

## 2017-06-29 DIAGNOSIS — K635 Polyp of colon: Secondary | ICD-10-CM

## 2017-06-29 DIAGNOSIS — D123 Benign neoplasm of transverse colon: Secondary | ICD-10-CM

## 2017-06-29 DIAGNOSIS — D128 Benign neoplasm of rectum: Secondary | ICD-10-CM

## 2017-06-29 DIAGNOSIS — Z1211 Encounter for screening for malignant neoplasm of colon: Secondary | ICD-10-CM

## 2017-06-29 DIAGNOSIS — K621 Rectal polyp: Secondary | ICD-10-CM

## 2017-06-29 MED ORDER — SODIUM CHLORIDE 0.9 % IV SOLN
500.0000 mL | Freq: Once | INTRAVENOUS | Status: DC
Start: 2017-06-29 — End: 2018-03-15

## 2017-06-29 NOTE — Progress Notes (Signed)
Pt's states no medical or surgical changes since previsit or office visit. 

## 2017-06-29 NOTE — Op Note (Signed)
[place] Patient Name: [name] Procedure Date: 06/29/2017 7:57 AM MRN: [id number] Endoscopist: Gatha Mayer , MD Age: 57 Referring MD:  Date of Birth: 05-19-1960 Gender: Female Account #: [id number] Procedure:                Colonoscopy Indications:              Screening for colorectal malignant neoplasm Medicines:                Propofol per Anesthesia, Monitored Anesthesia Care Procedure:                Pre-Anesthesia Assessment:                           - Prior to the procedure, a History and Physical                            was performed, and patient medications and                            allergies were reviewed. The patient's tolerance of                            previous anesthesia was also reviewed. The risks                            and benefits of the procedure and the sedation                            options and risks were discussed with the patient.                            All questions were answered, and informed consent                            was obtained. Prior Anticoagulants: The patient has                            taken no previous anticoagulant or antiplatelet                            agents. ASA Grade Assessment: II - A patient with                            mild systemic disease. After reviewing the risks                            and benefits, the patient was deemed in                            satisfactory condition to undergo the procedure.                           After obtaining informed consent, the colonoscope  was passed under direct vision. Throughout the                            procedure, the patient's blood pressure, pulse, and                            oxygen saturations were monitored continuously. The                            Colonoscope was introduced through the anus and                            advanced to the the cecum, identified by   appendiceal orifice and ileocecal valve. The                            colonoscopy was performed without difficulty. The                            patient tolerated the procedure well. The quality                            of the bowel preparation was excellent. The                            ileocecal valve, appendiceal orifice, and rectum                            were photographed. The bowel preparation used was                            Miralax. Scope In: 2:53:66 AM Scope Out: 8:29:34 AM Scope Withdrawal Time: 0 hours 11 minutes 1 second  Total Procedure Duration: 0 hours 12 minutes 47 seconds  Findings:                 The perianal and digital rectal examinations were                            normal.                           Two sessile polyps were found in the rectum and                            transverse colon. The polyps were diminutive in                            size. These polyps were removed with a cold snare.                            Resection and retrieval were complete. Verification                            of patient identification for the specimen was  done. Estimated blood loss was minimal.                           The exam was otherwise without abnormality on                            direct and retroflexion views. Complications:            No immediate complications. Estimated Blood Loss:     Estimated blood loss was minimal. Impression:               - Two diminutive polyps in the rectum and in the                            transverse colon, removed with a cold snare.                            Resected and retrieved.                           - The examination was otherwise normal on direct                            and retroflexion views. Recommendation:           - Patient has a contact number available for                            emergencies. The signs and symptoms of potential                            delayed  complications were discussed with the                            patient. Return to normal activities tomorrow.                            Written discharge instructions were provided to the                            patient.                           - Resume previous diet.                           - Continue present medications.                           - Repeat colonoscopy is recommended for                            surveillance. The colonoscopy date will be                            determined after pathology results from today's  exam become available for review.                           - Patient has a contact number available for                            emergencies. The signs and symptoms of potential                            delayed complications were discussed with the                            patient. Return to normal activities tomorrow.                            Written discharge instructions were provided to the                            patient.                           - Resume previous diet.                           - Continue present medications.                           - Repeat colonoscopy is recommended. The                            colonoscopy date will be determined after pathology                            results from today's exam become available for                            review. Gatha Mayer, MD 06/29/2017 8:40:15 AM This report has been signed electronically.

## 2017-06-29 NOTE — Progress Notes (Signed)
Report given to PACU, vss 

## 2017-06-29 NOTE — Progress Notes (Signed)
Called to room to assist during endoscopic procedure.  Patient ID and intended procedure confirmed with present staff. Received instructions for my participation in the procedure from the performing physician.  

## 2017-06-29 NOTE — Patient Instructions (Signed)
Handout given on polyps  YOU HAD AN ENDOSCOPIC PROCEDURE TODAY: Refer to the procedure report and other information in the discharge instructions given to you for any specific questions about what was found during the examination. If this information does not answer your questions, please call  office at [phone] to clarify.   YOU SHOULD EXPECT: Some feelings of bloating in the abdomen. Passage of more gas than usual. Walking can help get rid of the air that was put into your GI tract during the procedure and reduce the bloating. If you had a lower endoscopy (such as a colonoscopy or flexible sigmoidoscopy) you may notice spotting of blood in your stool or on the toilet paper. Some abdominal soreness may be present for a day or two, also.  DIET: Your first meal following the procedure should be a light meal and then it is ok to progress to your normal diet. A half-sandwich or bowl of soup is an example of a good first meal. Heavy or fried foods are harder to digest and may make you feel nauseous or bloated. Drink plenty of fluids but you should avoid alcoholic beverages for 24 hours. If you had a esophageal dilation, please see attached instructions for diet.    ACTIVITY: Your care partner should take you home directly after the procedure. You should plan to take it easy, moving slowly for the rest of the day. You can resume normal activity the day after the procedure however YOU SHOULD NOT DRIVE, use power tools, machinery or perform tasks that involve climbing or major physical exertion for 24 hours (because of the sedation medicines used during the test).   SYMPTOMS TO REPORT IMMEDIATELY: A gastroenterologist can be reached at any hour. Please call [phone]  for any of the following symptoms:  Following lower endoscopy (colonoscopy, flexible sigmoidoscopy) Excessive amounts of blood in the stool  Significant tenderness, worsening of abdominal pains  Swelling of the abdomen that is  new, acute  Fever of 100 or higher    FOLLOW UP:  If any biopsies were taken you will be contacted by phone or by letter within the next 1-3 weeks. Call [phone]  if you have not heard about the biopsies in 3 weeks.  Please also call with any specific questions about appointments or follow up tests.  

## 2017-06-30 ENCOUNTER — Telehealth: Payer: Self-pay

## 2017-06-30 NOTE — Telephone Encounter (Signed)
  Follow up Call-  Call back number 06/29/2017  Post procedure Call Back phone  # [phone]  Permission to leave phone message Yes  Some recent data might be hidden     Patient questions:  Do you have a fever, pain , or abdominal swelling? No. Pain Score  0 *  Have you tolerated food without any problems? Yes.    Have you been able to return to your normal activities? Yes.    Do you have any questions about your discharge instructions: Diet   No. Medications  No. Follow up visit  No.  Do you have questions or concerns about your Care? No.  Actions: * If pain score is 4 or above: No action needed, pain <4.

## 2017-07-09 ENCOUNTER — Encounter: Payer: Self-pay | Admitting: Internal Medicine

## 2017-07-09 NOTE — Progress Notes (Signed)
2 hyperplastic polyps Recall 2029

## 2017-07-26 ENCOUNTER — Ambulatory Visit (INDEPENDENT_AMBULATORY_CARE_PROVIDER_SITE_OTHER): Payer: 59 | Admitting: Neurology

## 2017-07-26 ENCOUNTER — Encounter: Payer: Self-pay | Admitting: Neurology

## 2017-07-26 VITALS — BP 140/80 | HR 95 | Ht 59.5 in | Wt 149.5 lb

## 2017-07-26 DIAGNOSIS — G8321 Monoplegia of upper limb affecting right dominant side: Secondary | ICD-10-CM

## 2017-07-26 DIAGNOSIS — R292 Abnormal reflex: Secondary | ICD-10-CM | POA: Diagnosis not present

## 2017-07-26 DIAGNOSIS — M62541 Muscle wasting and atrophy, not elsewhere classified, right hand: Secondary | ICD-10-CM | POA: Diagnosis not present

## 2017-07-26 DIAGNOSIS — G959 Disease of spinal cord, unspecified: Secondary | ICD-10-CM | POA: Diagnosis not present

## 2017-07-26 NOTE — Patient Instructions (Addendum)
Please call my office once you have your new insurance card so we can start to order testing as follows:  MRI cervical spine wwo contrast  Check labs  NCS/EMG of the right > left arm

## 2017-07-26 NOTE — Progress Notes (Signed)
[facility] Clinic Note - Initial Visit   Date: 07/26/17  [name] MRN: [id number] DOB: Jun 24, 1960   Dear Dr. Glori Bickers:  Thank you for your kind referral of [name] for consultation of right hand weakness. Although her history is well known to you, please allow [place] to reiterate it for the purpose of our medical record. The patient was accompanied to the clinic by self.   History of Present Illness: [name] is a 57 y.o. left-handed Caucasian female with tobacco abuse and psoriasis presenting for evaluation of right hand weakness.  In November 2017, she was helping coordinate a babyshower for her daughter and also birthday party for grandchild.  She recalls having difficulty with fine finger movements when decorating and since this time slowly has progressive weakness of the hand especially with fine finger movements.  There was no associated pain.  She constantly drops objects, has difficulty opening jars and bottles, and using scissors.  Around the summer of 2018, she began noticing wasting of the muscles in the hands.  She also has numbness and tingling which involves her hand, and occasionally the forearm.  She was recommended to use wrist splints, with no benefit.  She endorses shoulder and neck pain and often uses heat for relief.  She denies muscle twitches or cramps. No difficulty with talking/swallowing, shortness of breath, or leg weakness.  She works as a Air cabin crew, such as Editor, commissioning.  She has become more dependent on her left hand, which is also tingling from time to time.   Out-side paper records, electronic medical record, and images have been reviewed where available and summarized as:  Lab Results  Component Value Date   TSH 2.01 01/08/2015    Past Medical History:  Diagnosis Date  . Allergy   . Anxiety   . Carpal tunnel syndrome   . Osteoporosis   . Psoriasis   . Tobacco abuse   . Viral meningitis     Past  Surgical History:  Procedure Laterality Date  . ABDOMINAL HYSTERECTOMY  12/2004   partial  . Arm fracture     Bilateral  . CYSTECTOMY  May 2011   Roof of mouth  . US TRANSVAGINAL PELVIC MODIFIED  12/2004   Fibroids     Medications:  Outpatient Encounter Medications as of 07/26/2017  Medication Sig  . Calcium Carb-Cholecalciferol (CALCIUM 600 + D PO) Take 1 capsule by mouth daily.  . cetirizine-pseudoephedrine (ZYRTEC-D) 5-120 MG tablet Take 1 tablet by mouth 2 (two) times daily as needed for allergies.  . Multiple Vitamin (MULTIVITAMIN) tablet Take 1 tablet by mouth daily.    . mupirocin ointment (BACTROBAN) 2 % Apply 1 application topically daily. To affected area in nostrols  . OVER THE COUNTER MEDICATION Take 1 capsule by mouth daily. COGINUM   Facility-Administered Encounter Medications as of 07/26/2017  Medication  . 0.9 %  sodium chloride infusion     Allergies:  Allergies  Allergen Reactions  . Amoxicillin Nausea Only  . Augmentin [Amoxicillin-Pot Clavulanate] Nausea Only  . Pseudoephedrine     REACTION: Heart races at high doses.      Family History: Family History  Problem Relation Age of Onset  . COPD Mother   . Cancer Father        Lung CA, smoker  . Thyroid disease Sister   . Breast cancer Sister   . Colon cancer Neg Hx   . Stomach cancer Neg Hx   .  Esophageal cancer Neg Hx     Social History: Social History   Tobacco Use  . Smoking status: Current Every Day Smoker    Packs/day: 1.00    Years: 30.00    Pack years: 30.00    Types: Cigarettes  . Smokeless tobacco: Never Used  Substance Use Topics  . Alcohol use: Yes    Alcohol/week: 0.0 oz    Comment: occ.  maybe 3 drinks a month per pt  . Drug use: No   Social History   Social History Narrative   Lives with fiancee in a one story home.  Has one daughter.  Works as a Radiation protection practitioner.  Education: college.      Review of Systems:  CONSTITUTIONAL: No fevers, chills, night sweats, or weight  loss.   EYES: No visual changes or eye pain ENT: No hearing changes.  No history of nose bleeds.   RESPIRATORY: No cough, wheezing and shortness of breath.   CARDIOVASCULAR: Negative for chest pain, and palpitations.   GI: Negative for abdominal discomfort, blood in stools or black stools.  No recent change in bowel habits.   GU:  No history of incontinence.   MUSCLOSKELETAL: No history of joint pain or swelling.  No myalgias.   SKIN: Negative for lesions, rash, and itching.   HEMATOLOGY/ONCOLOGY: Negative for prolonged bleeding, bruising easily, and swollen nodes.  No history of cancer.   ENDOCRINE: Negative for cold or heat intolerance, polydipsia or goiter.   PSYCH:  No depression or anxiety symptoms.   NEURO: As Above.   Vital Signs:  BP 140/80   Pulse 95   Ht 4' 11.5" (1.511 m)   Wt 149 lb 8 oz (67.8 kg)   SpO2 96%   BMI 29.69 kg/m   General Medical Exam:   General:  Well appearing, comfortable.   Eyes/ENT: see cranial nerve examination.   Neck: No masses appreciated.  Full range of motion without tenderness.  No carotid bruits. Respiratory:  Clear to auscultation, good air entry bilaterally.   Cardiac:  Regular rate and rhythm, no murmur.   Extremities:  No deformities, edema, or skin discoloration.  Skin:  Psoriasis affecting the hands ands feet  Neurological Exam: MENTAL STATUS including orientation to time, place, person, recent and remote memory, attention span and concentration, language, and fund of knowledge is normal.  Speech is not dysarthric.  CRANIAL NERVES: II:  No visual field defects.  Unremarkable fundi.   III-IV-VI: Pupils equal round and reactive to light.  Normal conjugate, extra-ocular eye movements in all directions of gaze.  No nystagmus.  No ptosis. V:  Normal facial sensation.  Jaw jerk is absent.   VII:  Normal facial symmetry and movements.  No pathologic facial reflexes.  VIII:  Normal hearing and vestibular function.   IX-X:  Normal palatal  movement.   XI:  Normal shoulder shrug and head rotation.   XII:  Normal tongue strength and range of motion, no deviation or fasciculation.  MOTOR:  Severe wasting of the intrinsic hand muscles, (possibly overlapping with split hand, given the severe atrophy of the thenar eminence and FDI); mild forearm extensor muscle atrophy.  Rare fasciculation of the right FDI.  No tremor.  No pronator drift.  Tone is normal.    Right Upper Extremity:    Left Upper Extremity:    Deltoid  5/5   Deltoid  5/5   Biceps  5/5   Biceps  5/5   Triceps  5/5   Triceps  5/5   Wrist extensors  5-/5   Wrist extensors  5/5   Wrist flexors  5/5   Wrist flexors  5/5   Finger extensors  4/5   Finger extensors  5/5   Finger flexors  5-/5   Finger flexors  5/5   Dorsal interossei  3/5   Dorsal interossei  5/5   Abductor pollicis  2/5   Abductor pollicis  5/5   Tone (Ashworth scale)  0  Tone (Ashworth scale)  0   Right Lower Extremity:    Left Lower Extremity:    Hip flexors  5/5   Hip flexors  5/5   Hip extensors  5/5   Hip extensors  5/5   Knee flexors  5/5   Knee flexors  5/5   Knee extensors  5/5   Knee extensors  5/5   Dorsiflexors  5/5   Dorsiflexors  5/5   Plantarflexors  5/5   Plantarflexors  5/5   Toe extensors  5/5   Toe extensors  5/5   Toe flexors  5/5   Toe flexors  5/5   Tone (Ashworth scale)  0  Tone (Ashworth scale)  0   MSRs:  Right                                                                 Left brachioradialis 3+  brachioradialis 3+  biceps 3+  biceps 3+  triceps 3+  triceps 3+  patellar 3+  patellar 3+  ankle jerk 3+  ankle jerk 3+  Hoffman yes  Hoffman yes  plantar response Down  plantar response down   SENSORY:  Normal and symmetric perception of light touch, pinprick, vibration, and proprioception.  Romberg's sign absent.  Negative Tinel's sign at the right wrist and elbow.  COORDINATION/GAIT: Normal finger-to- nose-finger.  Finger tapping is slowed on the right due to weakness.   Gait narrow based and stable. Tandem and stressed gait intact.    IMPRESSION: Ms. Whittlesey is a 57 year-old female referred for evaluation of right hand weakness, which has been progressive since November 2017.  Her exam shows severe wasting of the intrinsic hand muscles and weakness with finger abductors, finger extensors, and trace weakness with wrist extension and finger flexors. Her reflexes are very brisk throughout making UMN pathology very likely.  Therefore, first step is imaging of the cervical spine to look for compressive myelopathy.  She does have intermittent paresthesia, much less than what would be expected in an entrapment neuropathy such as CTS or ulnar neuropathy.  The combination of UMN and LMN pathology also raises the possibility of motor neuron disease and she may need NCS/EMG of the arms if her imaging does not show structural pathology.  PLAN/RECOMMENDATIONS:  1.  Check CK, aldolase, ESR, CRP, vitamin B12, copper, zinc  2.  MRI cervical spine wwo contrast - ASAP 3.  NCS/EMG of the arms, going foward  Further recommendations pending results   Thank you for allowing me to participate in patient's care.  If I can answer any additional questions, I would be pleased to do so.    Sincerely,    Envy Meno K. Posey Pronto, DO

## 2017-12-27 ENCOUNTER — Ambulatory Visit: Payer: 59 | Admitting: Family Medicine

## 2017-12-27 ENCOUNTER — Encounter: Payer: Self-pay | Admitting: Family Medicine

## 2017-12-27 VITALS — BP 130/78 | HR 82 | Temp 98.2°F | Ht 59.5 in | Wt 149.2 lb

## 2017-12-27 DIAGNOSIS — F17200 Nicotine dependence, unspecified, uncomplicated: Secondary | ICD-10-CM | POA: Diagnosis not present

## 2017-12-27 DIAGNOSIS — J0100 Acute maxillary sinusitis, unspecified: Secondary | ICD-10-CM

## 2017-12-27 DIAGNOSIS — J019 Acute sinusitis, unspecified: Secondary | ICD-10-CM | POA: Insufficient documentation

## 2017-12-27 MED ORDER — PROMETHAZINE-DM 6.25-15 MG/5ML PO SYRP: 5.0000 mL | ORAL_SOLUTION | Freq: Four times a day (QID) | ORAL | 0 refills | Status: DC | PRN

## 2017-12-27 MED ORDER — FLUTICASONE PROPIONATE 50 MCG/ACT NA SUSP: 2.0000 | Freq: Every day | NASAL | 6 refills | Status: DC

## 2017-12-27 MED ORDER — DOXYCYCLINE HYCLATE 100 MG PO TABS: 100.0000 mg | ORAL_TABLET | Freq: Two times a day (BID) | ORAL | 0 refills | Status: DC

## 2017-12-27 NOTE — Assessment & Plan Note (Signed)
Cover with doxycycline (pcn allergic) Saline/steam/rest /fluids Enc strongly to quit smoking Trial of flonase (this may help sinus issues all year also) prometh-dm for cough (caution of sedation) Update if not starting to improve in a week or if worsening    Meds ordered this encounter  Medications  . doxycycline (VIBRA-TABS) 100 MG tablet    Sig: Take 1 tablet (100 mg total) by mouth 2 (two) times daily.    Dispense:  14 tablet    Refill:  0  . promethazine-dextromethorphan (PROMETHAZINE-DM) 6.25-15 MG/5ML syrup    Sig: Take 5 mLs by mouth 4 (four) times daily as needed for cough. Caution of sedation    Dispense:  118 mL    Refill:  0  . fluticasone (FLONASE) 50 MCG/ACT nasal spray    Sig: Place 2 sprays into both nostrils daily.    Dispense:  16 g    Refill:  6

## 2017-12-27 NOTE — Assessment & Plan Note (Signed)
Disc in detail risks of smoking and possible outcomes including copd, vascular/ heart disease, cancer , respiratory and sinus infections  Pt voices understanding She is not ready to quit  

## 2017-12-27 NOTE — Progress Notes (Signed)
Subjective:    Patient ID: [name], female    DOB: 02/12/1960, 57 y.o.   MRN: [id number]  HPI  Here for sinus /nasal congestion   Symptoms started about a week ago  Started with congestion [place] than usual  Then coughing-prod of green sputum  No wheeze or sob   Green nasal drainage and phlegm  Pain in face-under eyes and also general headaches   No fever  Ears- no pain /just muffled  Throat is sore from coughing  Otc: Zyrtec Nasal saline  No nasal steroids currently    Smoking status -1ppd  Not ready to quit Would like to eventually   Patient Active Problem List   Diagnosis Date Noted  . Acute sinusitis 12/27/2017  . Atrophy of muscle of right hand 07/26/2017  . Hyperreflexia 07/26/2017  . 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 Left knee pain 01/15/2014  . Neoplasm of skin 12/28/2012  . Osteoporosis 12/17/2012  . Colon cancer screening 12/17/2012  . Hyperlipidemia 12/17/2012  . Routine general medical examination at a health care facility 11/04/2011  . CARPAL TUNNEL SYNDROME 11/22/2006  . Generalized anxiety disorder 11/21/2006  . Allergic rhinitis 11/21/2006  . ASTHMA 11/21/2006  . 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 TOBACCO ABUSE 09/13/2006   Past Medical History:  Diagnosis Date  . Allergy   . Anxiety   . Carpal tunnel syndrome   . Osteoporosis   . Psoriasis   . Tobacco abuse   . Viral meningitis    Past Surgical History:  Procedure Laterality Date  . ABDOMINAL HYSTERECTOMY  12/2004   partial  . Arm fracture     Bilateral  . CYSTECTOMY  May 2011   Roof of mouth  . US TRANSVAGINAL PELVIC MODIFIED  12/2004   Fibroids   Social History   Tobacco Use  . Smoking status: Current Every Day Smoker    Packs/day: 1.00    Years: 30.00    Pack years: 30.00    Types: Cigarettes  . Smokeless tobacco: Never Used  Substance Use Topics  . Alcohol use: Yes    Alcohol/week: 0.0 standard drinks    Comment: occ.  maybe 3 drinks a  month per pt  . Drug use: No   Family History  Problem Relation Age of Onset  . COPD Mother   . Cancer Father        Lung CA, smoker  . Thyroid disease Sister   . Breast cancer Sister   . Colon cancer Neg Hx   . Stomach cancer Neg Hx   . Esophageal cancer Neg Hx    Allergies  Allergen Reactions  . Amoxicillin Nausea Only  . Augmentin [Amoxicillin-Pot Clavulanate] Nausea Only  . Pseudoephedrine     REACTION: Heart races at high doses.     Current Outpatient Medications on File Prior to Visit  Medication Sig Dispense Refill  . Calcium Carb-Cholecalciferol (CALCIUM 600 + D PO) Take 1 capsule by mouth daily.    . cetirizine-pseudoephedrine (ZYRTEC-D) 5-120 MG tablet Take 1 tablet by mouth 2 (two) times daily as needed for allergies. (Patient taking differently: Take 1 tablet by mouth 2 (two) times daily as needed for allergies. prefers 24hr zyrtec) 60 tablet 11  . Multiple Vitamin (MULTIVITAMIN) tablet Take 1 tablet by mouth daily.      . mupirocin ointment (BACTROBAN) 2 % Apply 1 application topically daily. To affected area in nostrols 22 g 2  . OVER THE COUNTER MEDICATION Take  1 capsule by mouth 2 (two) times daily. COGINUM      Current Facility-Administered Medications on File Prior to Visit  Medication Dose Route Frequency Provider Last Rate Last Dose  . 0.9 %  sodium chloride infusion  500 mL Intravenous Once Gatha Mayer, MD         Review of Systems  Constitutional: Positive for appetite change. Negative for fatigue and fever.  HENT: Positive for congestion, ear pain, postnasal drip, rhinorrhea and sinus pressure. Negative for nosebleeds, sore throat and voice change.   Eyes: Negative for pain, redness and itching.  Respiratory: Positive for cough. Negative for shortness of breath and wheezing.   Cardiovascular: Negative for chest pain.  Gastrointestinal: Negative for abdominal pain, diarrhea, nausea and vomiting.  Endocrine: Negative for polyuria.  Genitourinary:  Negative for dysuria, frequency and urgency.  Musculoskeletal: Negative for arthralgias and myalgias.  Allergic/Immunologic: Negative for immunocompromised state.  Neurological: Positive for headaches. Negative for dizziness, tremors, syncope, weakness and numbness.  Hematological: Negative for adenopathy. Does not bruise/bleed easily.  Psychiatric/Behavioral: Negative for dysphoric mood. The patient is not nervous/anxious.        Objective:   Physical Exam  Constitutional: She appears well-developed and well-nourished. No distress.  overwt and well app  HENT:  Head: Normocephalic and atraumatic.  Right Ear: External ear normal.  Left Ear: External ear normal.  Mouth/Throat: Oropharynx is clear and moist. No oropharyngeal exudate.  Nares are injected and congested  Bilateral maxillary sinus tenderness  Post nasal drip   Eyes: Pupils are equal, round, and reactive to light. Conjunctivae and EOM are normal. Right eye exhibits no discharge. Left eye exhibits no discharge. No scleral icterus.  Neck: Normal range of motion. Neck supple.  Cardiovascular: Normal rate and regular rhythm.  Pulmonary/Chest: Effort normal and breath sounds normal. No stridor. No respiratory distress. She has no wheezes. She has no rales. She exhibits no tenderness.  Good air exch No rales/rhonchi No wheeze even on forced exp  Lymphadenopathy:    She has no cervical adenopathy.  Neurological: She is alert. No cranial nerve deficit.  Skin: Skin is warm and dry. No rash noted.  Psychiatric: She has a normal mood and affect.          Assessment & Plan:   Problem List Items Addressed This Visit      Respiratory   Acute sinusitis - Primary    Cover with doxycycline (pcn allergic) Saline/steam/rest /fluids Enc strongly to quit smoking Trial of flonase (this may help sinus issues all year also) prometh-dm for cough (caution of sedation) Update if not starting to improve in a week or if worsening     Meds ordered this encounter  Medications  . doxycycline (VIBRA-TABS) 100 MG tablet    Sig: Take 1 tablet (100 mg total) by mouth 2 (two) times daily.    Dispense:  14 tablet    Refill:  0  . promethazine-dextromethorphan (PROMETHAZINE-DM) 6.25-15 MG/5ML syrup    Sig: Take 5 mLs by mouth 4 (four) times daily as needed for cough. Caution of sedation    Dispense:  118 mL    Refill:  0  . fluticasone (FLONASE) 50 MCG/ACT nasal spray    Sig: Place 2 sprays into both nostrils daily.    Dispense:  16 g    Refill:  6         Relevant Medications   doxycycline (VIBRA-TABS) 100 MG tablet   promethazine-dextromethorphan (PROMETHAZINE-DM) 6.25-15 MG/5ML syrup   fluticasone (  FLONASE) 50 MCG/ACT nasal spray     Other   TOBACCO ABUSE    Disc in detail risks of smoking and possible outcomes including copd, vascular/ heart disease, cancer , respiratory and sinus infections  Pt voices understanding  She is not ready to quit

## 2017-12-27 NOTE — Patient Instructions (Signed)
Drink lots of fluids and rest   Keep thinking about quitting smoking   Take the doxycycline as directed  Continue saline nasal spray  Try flonase -this may help ongoing sinus issues For cough- try prometh-dm with caution of sedation   Update if not starting to improve in a week or if worsening

## 2018-03-15 ENCOUNTER — Encounter: Payer: Self-pay | Admitting: Family Medicine

## 2018-03-15 ENCOUNTER — Ambulatory Visit (INDEPENDENT_AMBULATORY_CARE_PROVIDER_SITE_OTHER): Payer: 59 | Admitting: Family Medicine

## 2018-03-15 VITALS — BP 118/68 | HR 85 | Temp 98.2°F | Ht 59.25 in | Wt 141.2 lb

## 2018-03-15 DIAGNOSIS — J3089 Other allergic rhinitis: Secondary | ICD-10-CM

## 2018-03-15 DIAGNOSIS — M8589 Other specified disorders of bone density and structure, multiple sites: Secondary | ICD-10-CM

## 2018-03-15 DIAGNOSIS — Z1231 Encounter for screening mammogram for malignant neoplasm of breast: Secondary | ICD-10-CM

## 2018-03-15 DIAGNOSIS — F17200 Nicotine dependence, unspecified, uncomplicated: Secondary | ICD-10-CM

## 2018-03-15 DIAGNOSIS — E7800 Pure hypercholesterolemia, unspecified: Secondary | ICD-10-CM | POA: Diagnosis not present

## 2018-03-15 DIAGNOSIS — Z0000 Encounter for general adult medical examination without abnormal findings: Secondary | ICD-10-CM

## 2018-03-15 DIAGNOSIS — Z23 Encounter for immunization: Secondary | ICD-10-CM | POA: Diagnosis not present

## 2018-03-15 DIAGNOSIS — E2839 Other primary ovarian failure: Secondary | ICD-10-CM

## 2018-03-15 LAB — TSH: TSH: 1.77 u[IU]/mL (ref 0.35–4.50)

## 2018-03-15 LAB — CBC WITH DIFFERENTIAL/PLATELET
BASOS PCT: 0.5 % (ref 0.0–3.0)
Basophils Absolute: 0.1 10*3/uL (ref 0.0–0.1)
EOS ABS: 0.2 10*3/uL (ref 0.0–0.7)
EOS PCT: 1.2 % (ref 0.0–5.0)
HCT: 39.4 % (ref 36.0–46.0)
HEMOGLOBIN: 13.5 g/dL (ref 12.0–15.0)
LYMPHS PCT: 13.5 % (ref 12.0–46.0)
Lymphs Abs: 2.2 10*3/uL (ref 0.7–4.0)
MCHC: 34.2 g/dL (ref 30.0–36.0)
MCV: 91.7 fl (ref 78.0–100.0)
MONO ABS: 1.4 10*3/uL — AB (ref 0.1–1.0)
Monocytes Relative: 8.7 % (ref 3.0–12.0)
Neutro Abs: 12.3 10*3/uL — ABNORMAL HIGH (ref 1.4–7.7)
Neutrophils Relative %: 76.1 % (ref 43.0–77.0)
Platelets: 340 10*3/uL (ref 150.0–400.0)
RBC: 4.3 Mil/uL (ref 3.87–5.11)
RDW: 13 % (ref 11.5–15.5)
WBC: 16.1 10*3/uL — AB (ref 4.0–10.5)

## 2018-03-15 LAB — COMPREHENSIVE METABOLIC PANEL
ALBUMIN: 4.2 g/dL (ref 3.5–5.2)
ALK PHOS: 110 U/L (ref 39–117)
ALT: 27 U/L (ref 0–35)
AST: 26 U/L (ref 0–37)
BILIRUBIN TOTAL: 0.4 mg/dL (ref 0.2–1.2)
BUN: 7 mg/dL (ref 6–23)
CHLORIDE: 105 meq/L (ref 96–112)
CO2: 28 mEq/L (ref 19–32)
CREATININE: 0.93 mg/dL (ref 0.40–1.20)
Calcium: 9.8 mg/dL (ref 8.4–10.5)
GFR: 62.37 mL/min (ref >60.00)
Glucose, Bld: 106 mg/dL — ABNORMAL HIGH (ref 70–99)
Potassium: 4 mEq/L (ref 3.5–5.1)
SODIUM: 141 meq/L (ref 135–145)
TOTAL PROTEIN: 7.1 g/dL (ref 6.0–8.3)

## 2018-03-15 LAB — LIPID PANEL
CHOLESTEROL: 173 mg/dL (ref 0–200)
HDL: 40.6 mg/dL (ref >39.00)
LDL Cholesterol: 103 mg/dL — ABNORMAL HIGH (ref 0–99)
NonHDL: 132.16
TRIGLYCERIDES: 147 mg/dL (ref 0.0–149.0)
Total CHOL/HDL Ratio: 4
VLDL: 29.4 mg/dL (ref 0.0–40.0)

## 2018-03-15 MED ORDER — FLUTICASONE PROPIONATE 50 MCG/ACT NA SUSP: 2.0000 | Freq: Every day | NASAL | 11 refills | Status: DC

## 2018-03-15 MED ORDER — FEXOFENADINE-PSEUDOEPHED ER 180-240 MG PO TB24: 1.0000 | ORAL_TABLET | Freq: Every day | ORAL | 11 refills | Status: DC

## 2018-03-15 NOTE — Patient Instructions (Addendum)
New shingles vaccine is called Shingrix  If covered - call us to get on the wait list   We will schedule dexa and mammogram   Labs today

## 2018-03-15 NOTE — Assessment & Plan Note (Signed)
Reviewed health habits including diet and exercise and skin cancer prevention Reviewed appropriate screening tests for age  Also reviewed health mt list, fam hx and immunization status , as well as social and family history   See HPI Labs ordered for wellness  shingrix vaccine discussed Smoking cessation strongly encouraged  dexa and mammogram scheduled  Enc self breast exam

## 2018-03-15 NOTE — Assessment & Plan Note (Signed)
Refilled allegra D

## 2018-03-15 NOTE — Assessment & Plan Note (Signed)
dexa ordered Disc need for calcium/ vitamin D/ wt bearing exercise and bone density test every 2 y to monitor Disc safety/ fracture risk in detail   Enc smoking cessation

## 2018-03-15 NOTE — Assessment & Plan Note (Signed)
dexa ordered

## 2018-03-15 NOTE — Assessment & Plan Note (Signed)
Lipid panel pending  Disc goals for lipids and reasons to control them Rev last labs with pt Rev low sat fat diet in detail Disc goals of LDL under 100 given smoking status

## 2018-03-15 NOTE — Assessment & Plan Note (Signed)
Disc in detail risks of smoking and possible outcomes including copd, vascular/ heart disease, cancer , respiratory and sinus infections  Pt voices understanding Pt states she is not ready to quit  

## 2018-03-15 NOTE — Assessment & Plan Note (Signed)
Scheduled annual screening mammogram Nl breast exam today  Encouraged monthly self exams   

## 2018-03-15 NOTE — Progress Notes (Signed)
Subjective:    Patient ID: [name], female    DOB: Jun 03, 1960, 58 y.o.   MRN: [id number]  HPI  Here for health maintenance exam and to review chronic medical problems    Wt Readings from Last 3 Encounters:  03/15/18 141 lb 3 oz (64 kg)  12/27/17 149 lb 4 oz (67.7 kg)  07/26/17 149 lb 8 oz (67.8 kg)   28.28 kg/m   Due for labs  Did wake up with a bad cough today -not productive  Out of allergy medicine for several days  No fever    Mammogram 1/16- needs to have a mammogram  Self breast exam -neg  Has sister with breast cancer - in her 30s   Flu vaccine -has not had /given today    Smoking status - still about a ppd  No breathing symptoms   Just split with finance -stress Anxiety makes her itch (also ran out of her antihistamine)  Pap 8/11 neg  Hysterectomy  No gyn symptoms or problems   Zoster status -is interested in shingrix   Colon cancer screening -colonoscopy 5/19-hyperplastic polyps / recall 10 y   Tetanus shot 10/13  Osteopenia  Last dexa 12/14 Is a smoker  No falls or fractures  Takes ca and D   BP Readings from Last 3 Encounters:  03/15/18 118/68  12/27/17 130/78  07/26/17 140/80   Pulse Readings from Last 3 Encounters:  03/15/18 85  12/27/17 82  07/26/17 95      Hyperlipidemia Lab Results  Component Value Date   CHOL 205 (H) 01/08/2015   HDL 38.70 (L) 01/08/2015   LDLCALC 129 (H) 11/02/2009   LDLDIRECT 124.0 01/08/2015   TRIG 227.0 (H) 01/08/2015   CHOLHDL 5 01/08/2015  diet has not been great  Does not eat a whole lot  Not a lot of fried foods  Very little red meat  Southern breakfast 1-2 times per month   Due for labs today  Patient Active Problem List   Diagnosis Date Noted  . Screening mammogram, encounter for 03/15/2018  . Estrogen deficiency 03/15/2018  . Atrophy of muscle of right hand 07/26/2017  . Hyperreflexia 07/26/2017  . 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 Left knee pain  01/15/2014  . Neoplasm of skin 12/28/2012  . Osteopenia 12/17/2012  . Colon cancer screening 12/17/2012  . Hyperlipidemia 12/17/2012  . Routine general medical examination at a health care facility 11/04/2011  . CARPAL TUNNEL SYNDROME 11/22/2006  . Generalized anxiety disorder 11/21/2006  . Allergic rhinitis 11/21/2006  . ASTHMA 11/21/2006  . 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 TOBACCO ABUSE 09/13/2006   Past Medical History:  Diagnosis Date  . Allergy   . Anxiety   . Carpal tunnel syndrome   . Osteoporosis   . Psoriasis   . Tobacco abuse   . Viral meningitis    Past Surgical History:  Procedure Laterality Date  . ABDOMINAL HYSTERECTOMY  12/2004   partial  . Arm fracture     Bilateral  . CYSTECTOMY  May 2011   Roof of mouth  . US TRANSVAGINAL PELVIC MODIFIED  12/2004   Fibroids   Social History   Tobacco Use  . Smoking status: Current Every Day Smoker    Packs/day: 1.00    Years: 30.00    Pack years: 30.00    Types: Cigarettes  . Smokeless tobacco: Never Used  Substance Use Topics  . Alcohol use: Yes    Alcohol/week: 0.0  standard drinks    Comment: occ.  maybe 3 drinks a month per pt  . Drug use: No   Family History  Problem Relation Age of Onset  . COPD Mother   . Cancer Father        Lung CA, smoker  . Thyroid disease Sister   . Breast cancer Sister   . Colon cancer Neg Hx   . Stomach cancer Neg Hx   . Esophageal cancer Neg Hx    Allergies  Allergen Reactions  . Amoxicillin Nausea Only  . Augmentin [Amoxicillin-Pot Clavulanate] Nausea Only  . Pseudoephedrine     REACTION: Heart races at high doses.     Current Outpatient Medications on File Prior to Visit  Medication Sig Dispense Refill  . Calcium Carb-Cholecalciferol (CALCIUM 600 + D PO) Take 1 capsule by mouth daily.    . cetirizine-pseudoephedrine (ZYRTEC-D) 5-120 MG tablet Take 1 tablet by mouth 2 (two) times daily as needed for allergies. (Patient taking differently: Take 1 tablet by mouth 2  (two) times daily as needed for allergies. prefers 24hr zyrtec) 60 tablet 11  . Multiple Vitamin (MULTIVITAMIN) tablet Take 1 tablet by mouth daily.      . mupirocin ointment (BACTROBAN) 2 % Apply 1 application topically daily. To affected area in nostrols 22 g 2  . OVER THE COUNTER MEDICATION Take 1 capsule by mouth 2 (two) times daily. COGINUM      No current facility-administered medications on file prior to visit.     Review of Systems  Constitutional: Positive for fatigue. Negative for activity change, appetite change, fever and unexpected weight change.  HENT: Negative for congestion, ear pain, rhinorrhea, sinus pressure and sore throat.   Eyes: Negative for pain, redness and visual disturbance.  Respiratory: Positive for cough. Negative for chest tightness, shortness of breath and wheezing.   Cardiovascular: Negative for chest pain and palpitations.  Gastrointestinal: Negative for abdominal pain, blood in stool, constipation and diarrhea.  Endocrine: Negative for polydipsia and polyuria.  Genitourinary: Negative for dysuria, frequency and urgency.  Musculoskeletal: Negative for arthralgias, back pain and myalgias.  Skin: Negative for pallor and rash.  Allergic/Immunologic: Negative for environmental allergies.  Neurological: Negative for dizziness, syncope and headaches.  Hematological: Negative for adenopathy. Does not bruise/bleed easily.  Psychiatric/Behavioral: Negative for decreased concentration and dysphoric mood. The patient is not nervous/anxious.        Stressors  Recent break up with partner        Objective:   Physical Exam Constitutional:      General: She is not in acute distress.    Appearance: She is well-developed. She is not ill-appearing.     Comments: overwt  HENT:     Head: Normocephalic and atraumatic.     Right Ear: Tympanic membrane, ear canal and external ear normal.     Left Ear: Tympanic membrane, ear canal and external ear normal.     Nose:      Comments: Boggy nares     Mouth/Throat:     Mouth: Mucous membranes are moist.     Pharynx: Oropharynx is clear.  Eyes:     General: No scleral icterus.       Right eye: No discharge.     Conjunctiva/sclera: Conjunctivae normal.     Pupils: Pupils are equal, round, and reactive to light.  Neck:     Musculoskeletal: Normal range of motion and neck supple.     Thyroid: No thyromegaly.     Vascular:  No carotid bruit or JVD.  Cardiovascular:     Rate and Rhythm: Normal rate and regular rhythm.     Heart sounds: Normal heart sounds. No gallop.   Pulmonary:     Effort: Pulmonary effort is normal. No respiratory distress.     Breath sounds: Normal breath sounds. No wheezing or rhonchi.     Comments: Diffusely distant bs hacky cough No rales or rhonchi or wheeze Chest:     Chest wall: No tenderness.  Abdominal:     General: Bowel sounds are normal. There is no distension or abdominal bruit.     Palpations: Abdomen is soft. There is no mass.     Tenderness: There is no abdominal tenderness.  Genitourinary:    Comments: Breast exam: No mass, nodules, thickening, tenderness, bulging, retraction, inflamation, nipple discharge or skin changes noted.  No axillary or clavicular LA.     Musculoskeletal: Normal range of motion.        General: No tenderness.  Lymphadenopathy:     Cervical: No cervical adenopathy.  Skin:    General: Skin is warm and dry.     Coloration: Skin is not pale.     Findings: No erythema or rash.     Comments: Solar lentigines diffusely   Neurological:     General: No focal deficit present.     Mental Status: She is alert.     Cranial Nerves: No cranial nerve deficit.     Motor: No abnormal muscle tone.     Coordination: Coordination normal.     Deep Tendon Reflexes: Reflexes are normal and symmetric.  Psychiatric:        Mood and Affect: Mood normal.           Assessment & Plan:   Problem List Items Addressed This Visit      Respiratory   Allergic  rhinitis    Refilled allegra D         Musculoskeletal and Integument   Osteopenia    dexa ordered Disc need for calcium/ vitamin D/ wt bearing exercise and bone density test every 2 y to monitor Disc safety/ fracture risk in detail   Enc smoking cessation         Other   TOBACCO ABUSE    Disc in detail risks of smoking and possible outcomes including copd, vascular/ heart disease, cancer , respiratory and sinus infections  Pt voices understanding Pt states she is not ready to quit       Routine general medical examination at a health care facility - Primary    Reviewed health habits including diet and exercise and skin cancer prevention Reviewed appropriate screening tests for age  Also reviewed health mt list, fam hx and immunization status , as well as social and family history   See HPI Labs ordered for wellness  shingrix vaccine discussed Smoking cessation strongly encouraged  dexa and mammogram scheduled  Enc self breast exam       Relevant Orders   CBC with Differential/Platelet (Completed)   Comprehensive metabolic panel (Completed)   Lipid panel (Completed)   TSH (Completed)   Flu Vaccine QUAD 6+ mos PF IM (Fluarix Quad PF) (Completed)   Hyperlipidemia    Lipid panel pending  Disc goals for lipids and reasons to control them Rev last labs with pt Rev low sat fat diet in detail Disc goals of LDL under 100 given smoking status        Relevant Orders   Lipid  panel (Completed)   Screening mammogram, encounter for    Scheduled annual screening mammogram Nl breast exam today  Encouraged monthly self exams        Relevant Orders   MM 3D SCREEN BREAST BILATERAL   Estrogen deficiency    dexa ordered       Relevant Orders   DG Bone Density    Other Visit Diagnoses    Need for influenza vaccination       Relevant Orders   Flu Vaccine QUAD 6+ mos PF IM (Fluarix Quad PF) (Completed)

## 2018-03-16 ENCOUNTER — Telehealth: Payer: Self-pay | Admitting: *Deleted

## 2018-03-16 ENCOUNTER — Telehealth: Payer: Self-pay | Admitting: Family Medicine

## 2018-03-16 MED ORDER — DOXYCYCLINE HYCLATE 100 MG PO TABS: 100.0000 mg | ORAL_TABLET | Freq: Two times a day (BID) | ORAL | 0 refills | Status: DC

## 2018-03-16 NOTE — Telephone Encounter (Signed)
Pt returned call to [facility].

## 2018-03-16 NOTE — Telephone Encounter (Signed)
Per pt request left VM letting pt know Rx sent and advised of Dr. Marliss Coots comments

## 2018-03-16 NOTE — Telephone Encounter (Signed)
I sent in doxycycline Please let us know if this does not help

## 2018-03-16 NOTE — Telephone Encounter (Signed)
Addressed in result notes  

## 2018-03-16 NOTE — Telephone Encounter (Signed)
Left VM requesting pt to call the office back regarding lab results  

## 2018-03-16 NOTE — Telephone Encounter (Signed)
-----   Message from Tammi Sou, [place] sent at 03/16/2018  4:31 PM EST ----- Pt notified of lab results and Dr. Marliss Coots comments. Pt said that she was going to call us on Monday because she thinks she has developed a sinus inf. She has the facial pain and pressure. The cough and congestion is worse and now when she blows her nose the drainage is a little bloody but she said her sinus feel real full. Pt has no UTI sxs and no fever just all sinus sxs.  Pt uses [place] [address].

## 2018-04-06 MED ORDER — DOXYCYCLINE HYCLATE 100 MG PO TABS: 100.0000 mg | ORAL_TABLET | Freq: Two times a day (BID) | ORAL | 0 refills | Status: DC

## 2018-04-06 NOTE — Telephone Encounter (Signed)
Rx sent to pharmacy and advised of Dr. Marliss Coots comments

## 2018-04-06 NOTE — Telephone Encounter (Signed)
Pt said she completed the abx given on 03/16/18; symptoms did completely disappeared after finishing the doxycycline. Pt said started last wk could not give a specific day but stuffy nose, when blows nose sometimes colored green, clear or bloody mucus. No fever, sinus drainage at back at throat, prod cough with green phlegm. Pt request abx and doxycycline worked well last time. Pt is using OTC delsym for cough and nasal spray. [facility] [address]; pt request cb.

## 2018-04-06 NOTE — Addendum Note (Signed)
Addended by: Helene Shoe on: 04/06/2018 02:58 PM   Modules accepted: Orders

## 2018-04-06 NOTE — Telephone Encounter (Signed)
Please send in repeat course of doxycycline  If not resolved then please f/u (or if worse at any time)

## 2018-04-06 NOTE — Addendum Note (Signed)
Addended by: Tammi Sou on: 04/06/2018 04:56 PM   Modules accepted: Orders

## 2018-05-14 ENCOUNTER — Other Ambulatory Visit: Payer: 59

## 2018-05-14 ENCOUNTER — Ambulatory Visit: Payer: 59

## 2018-07-16 ENCOUNTER — Other Ambulatory Visit: Payer: Self-pay

## 2018-07-16 ENCOUNTER — Ambulatory Visit
Admission: RE | Admit: 2018-07-16 | Discharge: 2018-07-16 | Disposition: A | Discharge status: Home / Self Care | Payer: 59 | Source: Ambulatory Visit | Attending: Family Medicine | Admitting: Family Medicine

## 2018-07-16 DIAGNOSIS — Z1231 Encounter for screening mammogram for malignant neoplasm of breast: Secondary | ICD-10-CM

## 2018-07-16 DIAGNOSIS — E2839 Other primary ovarian failure: Secondary | ICD-10-CM

## 2018-07-24 ENCOUNTER — Encounter: Payer: Self-pay | Admitting: *Deleted

## 2018-07-24 ENCOUNTER — Telehealth: Payer: Self-pay | Admitting: *Deleted

## 2018-07-24 NOTE — Telephone Encounter (Signed)
Letter mailed regarding DEXA scan but encounter opened in error

## 2018-07-24 NOTE — Telephone Encounter (Signed)
-----   Message from Abner Greenspan, MD sent at 07/20/2018  9:19 PM EDT ----- Stable osteopenia Continue ca and D  Also exercise We can re check in 2 y

## 2019-02-22 ENCOUNTER — Telehealth: Payer: Self-pay | Admitting: Family Medicine

## 2019-02-22 MED ORDER — FEXOFENADINE-PSEUDOEPHED ER 180-240 MG PO TB24: 1.0000 | ORAL_TABLET | Freq: Every day | ORAL | 0 refills | Status: DC

## 2019-02-22 NOTE — Telephone Encounter (Signed)
Med refilled once and Carrie will reach out to pt to try and get CPE schedule  °

## 2019-02-22 NOTE — Telephone Encounter (Signed)
See prev note last CPE was 03/15/18 and no future appts., please advise

## 2019-02-22 NOTE — Telephone Encounter (Signed)
Pt called wanting shapale or dr tower to call her regarding her allergy meds.  Needs refill allegra   Can you refill this or does she need to make an appointment  [facility] [address]

## 2019-02-22 NOTE — Telephone Encounter (Signed)
Please schedule a spring or late spring PE and refill until then

## 2019-03-18 ENCOUNTER — Telehealth: Payer: Self-pay | Admitting: Family Medicine

## 2019-03-18 DIAGNOSIS — E7800 Pure hypercholesterolemia, unspecified: Secondary | ICD-10-CM

## 2019-03-18 DIAGNOSIS — Z0000 Encounter for general adult medical examination without abnormal findings: Secondary | ICD-10-CM

## 2019-03-18 NOTE — Telephone Encounter (Signed)
-----   Message from Cloyd Stagers, RT sent at 03/07/2019  2:03 PM EST ----- Regarding: Lab Orders for Tuesday 2.16.2021 Please place lab orders for Tuesday 2.16.2021, office visit for physical on Monday 2.22.2021 Thank you, Dyke Maes RT(R)

## 2019-03-19 ENCOUNTER — Other Ambulatory Visit: Payer: Self-pay

## 2019-03-19 ENCOUNTER — Other Ambulatory Visit (INDEPENDENT_AMBULATORY_CARE_PROVIDER_SITE_OTHER): Payer: 59

## 2019-03-19 DIAGNOSIS — Z0000 Encounter for general adult medical examination without abnormal findings: Secondary | ICD-10-CM

## 2019-03-19 DIAGNOSIS — E7800 Pure hypercholesterolemia, unspecified: Secondary | ICD-10-CM | POA: Diagnosis not present

## 2019-03-19 LAB — LIPID PANEL
Cholesterol: 205 mg/dL — ABNORMAL HIGH (ref 0–200)
HDL: 36.8 mg/dL — ABNORMAL LOW (ref >39.00)
LDL Cholesterol: 128 mg/dL — ABNORMAL HIGH (ref 0–99)
NonHDL: 167.81
Total CHOL/HDL Ratio: 6
Triglycerides: 199 mg/dL — ABNORMAL HIGH (ref 0.0–149.0)
VLDL: 39.8 mg/dL (ref 0.0–40.0)

## 2019-03-19 LAB — COMPREHENSIVE METABOLIC PANEL
ALT: 19 U/L (ref 0–35)
AST: 18 U/L (ref 0–37)
Albumin: 4.1 g/dL (ref 3.5–5.2)
Alkaline Phosphatase: 101 U/L (ref 39–117)
BUN: 8 mg/dL (ref 6–23)
CO2: 31 mEq/L (ref 19–32)
Calcium: 9.8 mg/dL (ref 8.4–10.5)
Chloride: 104 mEq/L (ref 96–112)
Creatinine, Ser: 0.86 mg/dL (ref 0.40–1.20)
GFR: 68.02 mL/min (ref >60.00)
Glucose, Bld: 104 mg/dL — ABNORMAL HIGH (ref 70–99)
Potassium: 4.1 mEq/L (ref 3.5–5.1)
Sodium: 140 mEq/L (ref 135–145)
Total Bilirubin: 0.5 mg/dL (ref 0.2–1.2)
Total Protein: 6.7 g/dL (ref 6.0–8.3)

## 2019-03-19 LAB — CBC WITH DIFFERENTIAL/PLATELET
Basophils Absolute: 0.1 10*3/uL (ref 0.0–0.1)
Basophils Relative: 0.9 % (ref 0.0–3.0)
Eosinophils Absolute: 0.2 10*3/uL (ref 0.0–0.7)
Eosinophils Relative: 2.7 % (ref 0.0–5.0)
HCT: 39.9 % (ref 36.0–46.0)
Hemoglobin: 13.6 g/dL (ref 12.0–15.0)
Lymphocytes Relative: 22.9 % (ref 12.0–46.0)
Lymphs Abs: 1.9 10*3/uL (ref 0.7–4.0)
MCHC: 34.2 g/dL (ref 30.0–36.0)
MCV: 92 fl (ref 78.0–100.0)
Monocytes Absolute: 0.7 10*3/uL (ref 0.1–1.0)
Monocytes Relative: 8.5 % (ref 3.0–12.0)
Neutro Abs: 5.5 10*3/uL (ref 1.4–7.7)
Neutrophils Relative %: 65 % (ref 43.0–77.0)
Platelets: 377 10*3/uL (ref 150.0–400.0)
RBC: 4.33 Mil/uL (ref 3.87–5.11)
RDW: 12.7 % (ref 11.5–15.5)
WBC: 8.5 10*3/uL (ref 4.0–10.5)

## 2019-03-19 LAB — TSH: TSH: 2.44 u[IU]/mL (ref 0.35–4.50)

## 2019-03-25 ENCOUNTER — Encounter: Payer: 59 | Admitting: Family Medicine

## 2019-03-29 ENCOUNTER — Other Ambulatory Visit: Payer: Self-pay

## 2019-03-29 ENCOUNTER — Encounter: Payer: Self-pay | Admitting: Family Medicine

## 2019-03-29 ENCOUNTER — Ambulatory Visit (INDEPENDENT_AMBULATORY_CARE_PROVIDER_SITE_OTHER): Payer: 59 | Admitting: Family Medicine

## 2019-03-29 VITALS — BP 132/70 | HR 92 | Temp 97.6°F | Ht 59.25 in | Wt 140.1 lb

## 2019-03-29 DIAGNOSIS — M8589 Other specified disorders of bone density and structure, multiple sites: Secondary | ICD-10-CM

## 2019-03-29 DIAGNOSIS — Z0000 Encounter for general adult medical examination without abnormal findings: Secondary | ICD-10-CM

## 2019-03-29 DIAGNOSIS — E7800 Pure hypercholesterolemia, unspecified: Secondary | ICD-10-CM | POA: Diagnosis not present

## 2019-03-29 DIAGNOSIS — F17200 Nicotine dependence, unspecified, uncomplicated: Secondary | ICD-10-CM | POA: Diagnosis not present

## 2019-03-29 DIAGNOSIS — R7309 Other abnormal glucose: Secondary | ICD-10-CM

## 2019-03-29 NOTE — Progress Notes (Signed)
Subjective:    Patient ID: [name], female    DOB: 1960-02-21, 59 y.o.   MRN: [id number]  This visit occurred during the SARS-CoV-2 public health emergency.  Safety protocols were in place, including screening questions prior to the visit, additional usage of staff PPE, and extensive cleaning of exam room while observing appropriate contact time as indicated for disinfecting solutions.    HPI  Here for health maintenance exam and to review chronic medical problems    Working from home -tends to work more  Feeling ok    Wt Readings from Last 3 Encounters:  03/29/19 140 lb 1 oz (63.5 kg)  03/15/18 141 lb 3 oz (64 kg)  12/27/17 149 lb 4 oz (67.7 kg)   28.05 kg/m  Has a treadmill -needs to use it  A lot of work to do outside   Zoster status - interested in shingrix   Smoking status  1ppd  Separated for a year-stressful  Not ready to quit yet  Father died of lung cancer   Colon cancer screening -colonoscopy 5/19    Flu vaccine -has not had  Will skip this year - plans to get the covid shot as soon as available   Mammogram 6/20 Self breast exam - no lumps   Tdap 10/13  dexa 6/20 -osteopenia  Falls- tripped in kitchen over a cat/no injuries Fractures -none new  Supplements -taking ca and D  Exercise - planning to walk and   BP Readings from Last 3 Encounters:  03/29/19 (!) 142/76  03/15/18 118/68  12/27/17 130/78  2nd check BP: 132/70   Pulse Readings from Last 3 Encounters:  03/29/19 92  03/15/18 85  12/27/17 82   Hyperlipidemia Lab Results  Component Value Date   CHOL 205 (H) 03/19/2019   CHOL 173 03/15/2018   CHOL 205 (H) 01/08/2015   Lab Results  Component Value Date   HDL 36.80 (L) 03/19/2019   HDL 40.60 03/15/2018   HDL 38.70 (L) 01/08/2015   Lab Results  Component Value Date   LDLCALC 128 (H) 03/19/2019   LDLCALC 103 (H) 03/15/2018   LDLCALC 129 (H) 11/02/2009   Lab Results  Component Value Date   TRIG 199.0 (H) 03/19/2019   TRIG 147.0 03/15/2018   TRIG 227.0 (H) 01/08/2015   Lab Results  Component Value Date   CHOLHDL 6 03/19/2019   CHOLHDL 4 03/15/2018   CHOLHDL 5 01/08/2015   Lab Results  Component Value Date   LDLDIRECT 124.0 01/08/2015   LDLDIRECT 150.0 12/13/2012   LDLDIRECT 114.5 11/04/2011  not exercising as much  LDL is up and HDL is down  No fam hx that she knows of  Has eaten more bacon lately    Glucose was elevated at 104 Eating more pasta lately  A little chocolate   Lab Results  Component Value Date   CREATININE 0.86 03/19/2019   BUN 8 03/19/2019   NA 140 03/19/2019   K 4.1 03/19/2019   CL 104 03/19/2019   CO2 31 03/19/2019   Lab Results  Component Value Date   WBC 8.5 03/19/2019   HGB 13.6 03/19/2019   HCT 39.9 03/19/2019   MCV 92.0 03/19/2019   PLT 377.0 03/19/2019   Lab Results  Component Value Date   TSH 2.44 03/19/2019    Lab Results  Component Value Date   ALT 19 03/19/2019   AST 18 03/19/2019   ALKPHOS 101 03/19/2019   BILITOT 0.5 03/19/2019  Patient Active Problem List   Diagnosis Date Noted  . Elevated glucose level 03/29/2019  . Screening mammogram, encounter for 03/15/2018  . Estrogen deficiency 03/15/2018  . Atrophy of muscle of right hand 07/26/2017  . Hyperreflexia 07/26/2017  . 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 Left knee pain 01/15/2014  . Neoplasm of skin 12/28/2012  . Osteopenia 12/17/2012  . Colon cancer screening 12/17/2012  . Hyperlipidemia 12/17/2012  . Routine general medical examination at a health care facility 11/04/2011  . CARPAL TUNNEL SYNDROME 11/22/2006  . Generalized anxiety disorder 11/21/2006  . Allergic rhinitis 11/21/2006  . ASTHMA 11/21/2006  . 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 TOBACCO ABUSE 09/13/2006   Past Medical History:  Diagnosis Date  . Allergy   . Anxiety   . Carpal tunnel syndrome   . Osteoporosis   . Psoriasis   . Tobacco abuse   . Viral meningitis    Past Surgical  History:  Procedure Laterality Date  . ABDOMINAL HYSTERECTOMY  12/2004   partial  . Arm fracture     Bilateral  . CYSTECTOMY  May 2011   Roof of mouth  . US TRANSVAGINAL PELVIC MODIFIED  12/2004   Fibroids   Social History   Tobacco Use  . Smoking status: Current Every Day Smoker    Packs/day: 1.00    Years: 30.00    Pack years: 30.00    Types: Cigarettes  . Smokeless tobacco: Never Used  Substance Use Topics  . Alcohol use: Yes    Alcohol/week: 0.0 standard drinks    Comment: occ.  maybe 3 drinks a month per pt  . Drug use: No   Family History  Problem Relation Age of Onset  . COPD Mother   . Cancer Father        Lung CA, smoker  . Thyroid disease Sister   . Breast cancer Sister   . Colon cancer Neg Hx   . Stomach cancer Neg Hx   . Esophageal cancer Neg Hx    Allergies  Allergen Reactions  . Amoxicillin Nausea Only  . Augmentin [Amoxicillin-Pot Clavulanate] Nausea Only  . Pseudoephedrine     REACTION: Heart races at high doses.     Current Outpatient Medications on File Prior to Visit  Medication Sig Dispense Refill  . Calcium Carb-Cholecalciferol (CALCIUM 600 + D PO) Take 1 capsule by mouth daily.    . cetirizine-pseudoephedrine (ZYRTEC-D) 5-120 MG tablet Take 1 tablet by mouth 2 (two) times daily as needed for allergies. (Patient taking differently: Take 1 tablet by mouth 2 (two) times daily as needed for allergies. prefers 24hr zyrtec) 60 tablet 11  . fluticasone (FLONASE) 50 MCG/ACT nasal spray Place 2 sprays into both nostrils daily. (Patient taking differently: Place 2 sprays into both nostrils daily as needed. ) 16 g 11  . Multiple Vitamin (MULTIVITAMIN) tablet Take 1 tablet by mouth daily.      Marland Kitchen OVER THE COUNTER MEDICATION Take 1 capsule by mouth 2 (two) times daily. COGINUM      No current facility-administered medications on file prior to visit.    Review of Systems     Objective:   Physical Exam Constitutional:      General: She is not in acute  distress.    Appearance: Normal appearance. She is well-developed. She is not ill-appearing or diaphoretic.     Comments: overwt and well app  HENT:     Head: Normocephalic and atraumatic.     Right  Ear: Tympanic membrane, ear canal and external ear normal.     Left Ear: Tympanic membrane, ear canal and external ear normal.     Nose: Nose normal. No congestion.     Mouth/Throat:     Mouth: Mucous membranes are moist.     Pharynx: Oropharynx is clear. No posterior oropharyngeal erythema.  Eyes:     General: No scleral icterus.    Extraocular Movements: Extraocular movements intact.     Conjunctiva/sclera: Conjunctivae normal.     Pupils: Pupils are equal, round, and reactive to light.  Neck:     Thyroid: No thyromegaly.     Vascular: No carotid bruit or JVD.  Cardiovascular:     Rate and Rhythm: Normal rate and regular rhythm.     Pulses: Normal pulses.     Heart sounds: Normal heart sounds. No gallop.   Pulmonary:     Effort: Pulmonary effort is normal. No respiratory distress.     Breath sounds: Normal breath sounds. No wheezing.     Comments: Good air exch Chest:     Chest wall: No tenderness.  Abdominal:     General: Bowel sounds are normal. There is no distension or abdominal bruit.     Palpations: Abdomen is soft. There is no mass.     Tenderness: There is no abdominal tenderness.     Hernia: No hernia is present.  Genitourinary:    Comments: Breast exam: No mass, nodules, thickening, tenderness, bulging, retraction, inflamation, nipple discharge or skin changes noted.  No axillary or clavicular LA.     Musculoskeletal:        General: No tenderness. Normal range of motion.     Cervical back: Normal range of motion and neck supple. No rigidity. No muscular tenderness.     Right lower leg: No edema.     Left lower leg: No edema.  Lymphadenopathy:     Cervical: No cervical adenopathy.  Skin:    General: Skin is warm and dry.     Coloration: Skin is not pale.      Findings: No erythema or rash.     Comments: Solar lentigines diffusely   Neurological:     Mental Status: She is alert. Mental status is at baseline.     Cranial Nerves: No cranial nerve deficit.     Motor: No abnormal muscle tone.     Coordination: Coordination normal.     Gait: Gait normal.     Deep Tendon Reflexes: Reflexes are normal and symmetric. Reflexes normal.  Psychiatric:        Mood and Affect: Mood normal.        Cognition and Memory: Cognition and memory normal.           Assessment & Plan:   Problem List Items Addressed This Visit      Musculoskeletal and Integument   Osteopenia    Reviewed dexa 6/20 and copy of report given  No fractures Discussed fall prevention  Taking ca and D Strongly enc to quit smoking Planning to walk for exercise         Other   TOBACCO ABUSE    Disc in detail risks of smoking and possible outcomes including copd, vascular/ heart disease, cancer , respiratory and sinus infections  Pt voices understanding Pt is not ready to quit at this time  Disc role in done density problems as well       Routine general medical examination at a health care facility -  Primary    Reviewed health habits including diet and exercise and skin cancer prevention Reviewed appropriate screening tests for age  Also reviewed health mt list, fam hx and immunization status , as well as social and family history   See HPI Labs reviewed  Discussed shingrix vaccine  Enc to consider smoking cessation  Enc exercise and Ca plus D for bone health  Skipping flu vaccine so she can get covid imms as soon as avail       Hyperlipidemia    Disc goals for lipids and reasons to control them Rev last labs with pt Rev low sat fat diet in detail  LDL up to 128 -diet has not been good  She wants to work on this and re check  Would start statin if LDL does not go below 100  Extra risk with smoking        Elevated glucose level    Mild at 104 disc imp of  low glycemic diet and wt loss to prevent DM2  Will check A1C with next labs

## 2019-03-29 NOTE — Patient Instructions (Addendum)
If you are interested in the shingles vaccine series (Shingrix), call your insurance or pharmacy to check on coverage and location it must be given.  If affordable - you can schedule it here or at your pharmacy depending on coverage   When you can get it- get your covid vaccine series  Your cholesterol is up  LDL is 128 (goal is below 100)  You are at much greater risk for vascular disease due to smoking with high cholesterol  Avoid red meat/ fried foods/ egg yolks/ fatty breakfast meats/ butter, cheese and high fat dairy/ and shellfish    If no improvement in the future - we would consider medication   Glucose is up a bit  Try to get most of your carbohydrates from produce (with the exception of white potatoes)  Eat less bread/pasta/rice/snack foods/cereals/sweets and other items from the middle of the grocery store (processed carbs)   Take care of yourself Please think about quitting smoking and let me know if you need help

## 2019-03-31 NOTE — Assessment & Plan Note (Signed)
Disc goals for lipids and reasons to control them Rev last labs with pt Rev low sat fat diet in detail  LDL up to 128 -diet has not been good  She wants to work on this and re check  Would start statin if LDL does not go below 100  Extra risk with smoking

## 2019-03-31 NOTE — Assessment & Plan Note (Signed)
Reviewed dexa 6/20 and copy of report given  No fractures Discussed fall prevention  Taking ca and D Strongly enc to quit smoking Planning to walk for exercise

## 2019-03-31 NOTE — Assessment & Plan Note (Signed)
Reviewed health habits including diet and exercise and skin cancer prevention Reviewed appropriate screening tests for age  Also reviewed health mt list, fam hx and immunization status , as well as social and family history   See HPI Labs reviewed  Discussed shingrix vaccine  Enc to consider smoking cessation  Enc exercise and Ca plus D for bone health  Skipping flu vaccine so she can get covid imms as soon as avail

## 2019-03-31 NOTE — Assessment & Plan Note (Signed)
Disc in detail risks of smoking and possible outcomes including copd, vascular/ heart disease, cancer , respiratory and sinus infections  Pt voices understanding Pt is not ready to quit at this time  Disc role in done density problems as well

## 2019-03-31 NOTE — Assessment & Plan Note (Signed)
Mild at 104 disc imp of low glycemic diet and wt loss to prevent DM2  Will check A1C with next labs

## 2019-04-08 ENCOUNTER — Other Ambulatory Visit: Payer: Self-pay | Admitting: Family Medicine

## 2019-04-10 ENCOUNTER — Telehealth: Payer: Self-pay | Admitting: Family Medicine

## 2019-04-10 DIAGNOSIS — J3089 Other allergic rhinitis: Secondary | ICD-10-CM

## 2019-04-12 MED ORDER — FEXOFENADINE-PSEUDOEPHED ER 180-240 MG PO TB24: 1.0000 | ORAL_TABLET | Freq: Two times a day (BID) | ORAL | 0 refills | Status: DC | PRN

## 2019-04-12 NOTE — Telephone Encounter (Signed)
Patient advised and said thank you

## 2019-04-12 NOTE — Telephone Encounter (Signed)
Refill sent to pharmacy.   

## 2019-04-12 NOTE — Addendum Note (Signed)
Addended by: Pleas Koch on: 04/12/2019 04:57 PM   Modules accepted: Orders

## 2019-04-12 NOTE — Addendum Note (Signed)
Addended by: Kris Mouton on: 04/12/2019 08:30 AM   Modules accepted: Orders

## 2019-04-12 NOTE — Telephone Encounter (Signed)
Patient called today in regards to [place] D medication that has been denied. Patient states she saw Dr Glori Bickers in february for a physical and discussed getting this as a RX instead of OTC because she ends up running out of the supply if she tries to get it OTC (stores run out of this). Dr Glori Bickers advised patient that she just needs to let us know and we would refill. I advised patient I would send it to another provider to review today and apologized for the delay. Patient took her last pill last night. Pharmacy never notified her that we denied the refill request until she called them today. Please review for Dr Glori Bickers.

## 2019-04-15 MED ORDER — FEXOFENADINE-PSEUDOEPHED ER 60-120 MG PO TB12: 1.0000 | ORAL_TABLET | Freq: Two times a day (BID) | ORAL | 3 refills | Status: DC | PRN

## 2019-04-15 NOTE — Telephone Encounter (Signed)
Pt called stating the pharmacy will not fill this because they said the dosage is too high.

## 2019-04-15 NOTE — Telephone Encounter (Signed)
I sent the lower dose 60-120  I cannot seem to get the old px off her med list for some reason

## 2019-04-15 NOTE — Addendum Note (Signed)
Addended by: Loura Pardon A on: 04/15/2019 10:00 AM   Modules accepted: Orders

## 2019-04-15 NOTE — Telephone Encounter (Signed)
Pt notified new dose sent and higher dose was removed from med list

## 2019-04-15 NOTE — Telephone Encounter (Signed)
See prev note, ? If you want to send in a lower dose of the allegra-D Rx strength

## 2019-04-18 MED ORDER — FEXOFENADINE-PSEUDOEPHED ER 180-240 MG PO TB24: 1.0000 | ORAL_TABLET | Freq: Every day | ORAL | 3 refills | Status: DC | PRN

## 2019-04-18 NOTE — Telephone Encounter (Signed)
Pt said that she did not pick up allegra at [facility] on [address] on 04/17/19 because it was a 12 hr med and pt wants the allegra that is 24 hour. Pt request cb when med sent to [facility] on [address].

## 2019-04-18 NOTE — Addendum Note (Signed)
Addended by: Loura Pardon A on: 04/18/2019 12:48 PM   Modules accepted: Orders

## 2019-04-18 NOTE — Telephone Encounter (Signed)
Pt notified Rx sent to [facility]

## 2019-04-18 NOTE — Telephone Encounter (Signed)
I think the 24 hour med is the higher dose that will not be covered

## 2019-04-18 NOTE — Telephone Encounter (Signed)
I sent it  

## 2019-04-18 NOTE — Telephone Encounter (Signed)
[facility] (where we sent last 2 Rxs) and they advised me that the only reason they didn't fill the 180-240mg  does is because they didn't receive the electronic refill that was sent on 04/12/19, they said that if pt wants the higher dose then they are okay with [place] sending it in and they will try to fill it now. It was the pt that called and said they wouldn't fill it due to it being "to high of a dose" they said that was not correct they just didn't receive the refill. Pt is requesting we send it to new pharmacy [facility]

## 2020-06-16 ENCOUNTER — Ambulatory Visit: Payer: 59 | Admitting: Diagnostic Neuroimaging

## 2020-12-09 ENCOUNTER — Other Ambulatory Visit: Payer: Self-pay | Admitting: Family Medicine

## 2020-12-09 DIAGNOSIS — Z1231 Encounter for screening mammogram for malignant neoplasm of breast: Secondary | ICD-10-CM

## 2020-12-10 ENCOUNTER — Ambulatory Visit
Admission: RE | Admit: 2020-12-10 | Discharge: 2020-12-10 | Disposition: A | Discharge status: Home / Self Care | Payer: 59 | Source: Ambulatory Visit | Attending: Family Medicine | Admitting: Family Medicine

## 2020-12-10 ENCOUNTER — Other Ambulatory Visit: Payer: Self-pay

## 2020-12-10 DIAGNOSIS — Z1231 Encounter for screening mammogram for malignant neoplasm of breast: Secondary | ICD-10-CM

## 2020-12-11 ENCOUNTER — Other Ambulatory Visit: Payer: Self-pay | Admitting: Family Medicine

## 2020-12-11 DIAGNOSIS — R928 Other abnormal and inconclusive findings on diagnostic imaging of breast: Secondary | ICD-10-CM

## 2020-12-14 ENCOUNTER — Other Ambulatory Visit: Payer: Self-pay | Admitting: Family Medicine

## 2020-12-14 ENCOUNTER — Other Ambulatory Visit: Payer: Self-pay

## 2020-12-14 ENCOUNTER — Ambulatory Visit
Admission: RE | Admit: 2020-12-14 | Discharge: 2020-12-14 | Disposition: A | Discharge status: Home / Self Care | Payer: 59 | Source: Ambulatory Visit | Attending: Family Medicine | Admitting: Family Medicine

## 2020-12-14 DIAGNOSIS — R921 Mammographic calcification found on diagnostic imaging of breast: Secondary | ICD-10-CM

## 2020-12-14 DIAGNOSIS — R928 Other abnormal and inconclusive findings on diagnostic imaging of breast: Secondary | ICD-10-CM

## 2020-12-20 ENCOUNTER — Telehealth: Payer: Self-pay | Admitting: Family Medicine

## 2020-12-20 DIAGNOSIS — E7800 Pure hypercholesterolemia, unspecified: Secondary | ICD-10-CM

## 2020-12-20 DIAGNOSIS — Z0000 Encounter for general adult medical examination without abnormal findings: Secondary | ICD-10-CM

## 2020-12-20 DIAGNOSIS — R7309 Other abnormal glucose: Secondary | ICD-10-CM

## 2020-12-20 NOTE — Telephone Encounter (Signed)
-----   Message from Ellamae Sia sent at 12/08/2020 10:14 AM EST ----- Regarding: Lab orders for Monday, 11.21.22 Patient is scheduled for CPX labs, please order future labs, Thanks , Karna Christmas

## 2020-12-21 ENCOUNTER — Other Ambulatory Visit: Payer: Self-pay

## 2020-12-21 ENCOUNTER — Other Ambulatory Visit (INDEPENDENT_AMBULATORY_CARE_PROVIDER_SITE_OTHER): Payer: 59

## 2020-12-21 DIAGNOSIS — R7309 Other abnormal glucose: Secondary | ICD-10-CM

## 2020-12-21 DIAGNOSIS — E7800 Pure hypercholesterolemia, unspecified: Secondary | ICD-10-CM | POA: Diagnosis not present

## 2020-12-21 DIAGNOSIS — Z0000 Encounter for general adult medical examination without abnormal findings: Secondary | ICD-10-CM

## 2020-12-21 LAB — HEMOGLOBIN A1C: Hgb A1c MFr Bld: 5.9 % (ref 4.6–6.5)

## 2020-12-21 LAB — CBC WITH DIFFERENTIAL/PLATELET
Basophils Absolute: 0.1 10*3/uL (ref 0.0–0.1)
Basophils Relative: 1.1 % (ref 0.0–3.0)
Eosinophils Absolute: 0.2 10*3/uL (ref 0.0–0.7)
Eosinophils Relative: 2 % (ref 0.0–5.0)
HCT: 41.6 % (ref 36.0–46.0)
Hemoglobin: 14.1 g/dL (ref 12.0–15.0)
Lymphocytes Relative: 23.7 % (ref 12.0–46.0)
Lymphs Abs: 2 10*3/uL (ref 0.7–4.0)
MCHC: 33.8 g/dL (ref 30.0–36.0)
MCV: 90.9 fl (ref 78.0–100.0)
Monocytes Absolute: 0.8 10*3/uL (ref 0.1–1.0)
Monocytes Relative: 9.4 % (ref 3.0–12.0)
Neutro Abs: 5.4 10*3/uL (ref 1.4–7.7)
Neutrophils Relative %: 63.8 % (ref 43.0–77.0)
Platelets: 307 10*3/uL (ref 150.0–400.0)
RBC: 4.58 Mil/uL (ref 3.87–5.11)
RDW: 12.4 % (ref 11.5–15.5)
WBC: 8.5 10*3/uL (ref 4.0–10.5)

## 2020-12-21 LAB — COMPREHENSIVE METABOLIC PANEL
ALT: 17 U/L (ref 0–35)
AST: 15 U/L (ref 0–37)
Albumin: 4.3 g/dL (ref 3.5–5.2)
Alkaline Phosphatase: 102 U/L (ref 39–117)
BUN: 11 mg/dL (ref 6–23)
CO2: 28 mEq/L (ref 19–32)
Calcium: 9.9 mg/dL (ref 8.4–10.5)
Chloride: 105 mEq/L (ref 96–112)
Creatinine, Ser: 0.89 mg/dL (ref 0.40–1.20)
GFR: 71.31 mL/min (ref >60.00)
Glucose, Bld: 112 mg/dL — ABNORMAL HIGH (ref 70–99)
Potassium: 4.3 mEq/L (ref 3.5–5.1)
Sodium: 139 mEq/L (ref 135–145)
Total Bilirubin: 0.5 mg/dL (ref 0.2–1.2)
Total Protein: 6.6 g/dL (ref 6.0–8.3)

## 2020-12-21 LAB — TSH: TSH: 1.78 u[IU]/mL (ref 0.35–5.50)

## 2020-12-21 LAB — LIPID PANEL
Cholesterol: 226 mg/dL — ABNORMAL HIGH (ref 0–200)
HDL: 38.1 mg/dL — ABNORMAL LOW (ref >39.00)
NonHDL: 188.05
Total CHOL/HDL Ratio: 6
Triglycerides: 223 mg/dL — ABNORMAL HIGH (ref 0.0–149.0)
VLDL: 44.6 mg/dL — ABNORMAL HIGH (ref 0.0–40.0)

## 2020-12-21 LAB — LDL CHOLESTEROL, DIRECT: Direct LDL: 145 mg/dL

## 2020-12-28 ENCOUNTER — Other Ambulatory Visit: Payer: Self-pay

## 2020-12-28 ENCOUNTER — Encounter: Payer: Self-pay | Admitting: Family Medicine

## 2020-12-28 ENCOUNTER — Ambulatory Visit (INDEPENDENT_AMBULATORY_CARE_PROVIDER_SITE_OTHER): Payer: 59 | Admitting: Family Medicine

## 2020-12-28 VITALS — BP 130/80 | HR 74 | Temp 97.6°F | Ht 59.5 in | Wt 143.5 lb

## 2020-12-28 DIAGNOSIS — M8589 Other specified disorders of bone density and structure, multiple sites: Secondary | ICD-10-CM

## 2020-12-28 DIAGNOSIS — Z0000 Encounter for general adult medical examination without abnormal findings: Secondary | ICD-10-CM

## 2020-12-28 DIAGNOSIS — E7800 Pure hypercholesterolemia, unspecified: Secondary | ICD-10-CM

## 2020-12-28 DIAGNOSIS — Z23 Encounter for immunization: Secondary | ICD-10-CM | POA: Diagnosis not present

## 2020-12-28 DIAGNOSIS — E2839 Other primary ovarian failure: Secondary | ICD-10-CM

## 2020-12-28 DIAGNOSIS — R7309 Other abnormal glucose: Secondary | ICD-10-CM

## 2020-12-28 DIAGNOSIS — F17200 Nicotine dependence, unspecified, uncomplicated: Secondary | ICD-10-CM

## 2020-12-28 NOTE — Assessment & Plan Note (Signed)
Disc in detail risks of smoking and possible outcomes including copd, vascular/ heart disease, cancer , respiratory and sinus infections  Pt voices understanding Pt is not ready to quit  Discussed the lung cancer screening program with [facility]/she has more than 30 pack years  She declined but will let us know if she changes her mind

## 2020-12-28 NOTE — Patient Instructions (Addendum)
If you are interested in the shingles vaccine series (Shingrix), call your insurance or pharmacy to check on coverage and location it must be given.  If affordable - you can schedule it here or at your pharmacy depending on coverage    There is a program through [facility] for lung cancer screening with serial CT scans Let me know if you are interested   For cholesterol  Avoid red meat/ fried foods/ egg yolks/ fatty breakfast meats/ butter, cheese and high fat dairy/ and shellfish    Try to get 1200-1500 mg of calcium per day with at least 1000 iu of vitamin D - for bone health   Flu shot today    Please call to schedule your bone density test Please call the location of your choice from the menu below to schedule your Mammogram and/or Bone Density appointment.    [facility]                      Phone:  [phone], [place] [address]                                                             Services: Traditional and 3D Mammogram, [place] [facility] Density                 Phone: [phone] [address], [place] [address]    Service: Bone Density ONLY   *this site does NOT perform mammograms  [facility]                        Phone:  [phone] [address], [place] [address]                                            Services:  3D Mammogram and [place] at [facility]   Phone:  [phone]   [address]  [place], [place] [address]                                            Services: 3D Mammogram and [place] at [facility])  Phone:  [phone]   [address] Room [place], [place] [address]                                              Services:  3D Mammogram and Bone Density

## 2020-12-28 NOTE — Assessment & Plan Note (Signed)
Reviewed health habits including diet and exercise and skin cancer prevention Reviewed appropriate screening tests for age  Also reviewed health mt list, fam hx and immunization status , as well as social and family history   Labs reviewed  covid immunized Interested in shingrix if covered  Flu shot given today  Counseled on smoking cessation  Mammogram and colonoscopy are utd  dexa ordered Enc to get started back on ca and D

## 2020-12-28 NOTE — Progress Notes (Signed)
Subjective:    Patient ID: [name], female    DOB: [birth year], 60 y.o.   MRN: [id number]  This visit occurred during the SARS-CoV-2 public health emergency.  Safety protocols were in place, including screening questions prior to the visit, additional usage of staff PPE, and extensive cleaning of exam room while observing appropriate contact time as indicated for disinfecting solutions.   HPI Here for health maintenance exam and to review chronic medical problems    Wt Readings from Last 3 Encounters:  12/28/20 143 lb 8 oz (65.1 kg)  03/29/19 140 lb 1 oz (63.5 kg)  03/15/18 141 lb 3 oz (64 kg)   28.50 kg/m  Working and spending time with grand kids   Taking care of herself -fair/doing better  Exercise -when she can  Had surgery in May- that slowed her down (R arm) Doing PT , has f/u planned in the spring   Itchy skin on back lately  Dry skin in the family  Using lotion applicator with handles  Takes hot showers  She does use heat wraps on neck/shoulders   Covid status -immunized  Zoster status-interested with shingrix  Flu shot -today  Tdap 11/2011  Smoking status - about 1ppd or a little more (if working from home)  Does not finish a whole cig Not ready to quit yet  No breathing changes  Her father died of lung cancer   Pap 09-24-2009 normal Then hysterectomy   Mammogram 11/22- had a call back in 6 months Sister had breast cancer Self breast exam - no lumps or change   Colonoscopy 05/2017  10 y recall   Osteopenia  Dexa 07/2018 Not enough exercise Ca and D- she stopped it for surgery   Mood -good  Depression screen [facility] 2/9 03/29/2019 03/15/2018 04/07/2017 12/17/2012  Decreased Interest 0 0 0 0  Down, Depressed, Hopeless 0 0 0 0  PHQ - 2 Score 0 0 0 0  Altered sleeping 0 0 - -  Tired, decreased energy 0 1 - -  Change in appetite 0 2 - -  Feeling bad or failure about yourself  0 1 - -  Trouble concentrating 0 0 - -  Moving slowly or fidgety/restless 0  1 - -  Suicidal thoughts 0 0 - -  PHQ-9 Score 0 5 - -  Difficult doing work/chores Not difficult at all - - -     BP Readings from Last 3 Encounters:  12/28/20 130/80  03/29/19 132/70  03/15/18 118/68    Pulse Readings from Last 3 Encounters:  12/28/20 74  03/29/19 92  03/15/18 85     H/o elevated glucose  Lab Results  Component Value Date   HGBA1C 5.9 12/21/2020   Hyperlipidemia  Lab Results  Component Value Date   CHOL 226 (H) 12/21/2020   CHOL 205 (H) 03/19/2019   CHOL 173 03/15/2018   Lab Results  Component Value Date   HDL 38.10 (L) 12/21/2020   HDL 36.80 (L) 03/19/2019   HDL 40.60 03/15/2018   Lab Results  Component Value Date   LDLCALC 128 (H) 03/19/2019   LDLCALC 103 (H) 03/15/2018   LDLCALC 129 (H) 11/02/2009   Lab Results  Component Value Date   TRIG 223.0 (H) 12/21/2020   TRIG 199.0 (H) 03/19/2019   TRIG 147.0 03/15/2018   Lab Results  Component Value Date   CHOLHDL 6 12/21/2020   CHOLHDL 6 03/19/2019   CHOLHDL 4 03/15/2018   Lab Results  Component  Value Date   LDLDIRECT 145.0 12/21/2020   LDLDIRECT 124.0 01/08/2015   LDLDIRECT 150.0 12/13/2012   LDL is steadily going up  Some heart dz in the family -mother   Diet is not great  Eating badly  Some hamburger  Some fried fish and other fried things  Some ice cream this summer    The 10-year ASCVD risk score (Arnett DK, et al., 2019) is: 10.9%   Values used to calculate the score:     Age: 30 years     Sex: Female     Is Non-Hispanic African American: No     Diabetic: No     Tobacco smoker: Yes     Systolic Blood Pressure: 295 mmHg     Is BP treated: No     HDL Cholesterol: 38.1 mg/dL     Total Cholesterol: 226 mg/dL   Other labs Lab Results  Component Value Date   CREATININE 0.89 12/21/2020   BUN 11 12/21/2020   NA 139 12/21/2020   K 4.3 12/21/2020   CL 105 12/21/2020   CO2 28 12/21/2020   Lab Results  Component Value Date   ALT 17 12/21/2020   AST 15 12/21/2020    ALKPHOS 102 12/21/2020   BILITOT 0.5 12/21/2020   Glucose fasting 112   Lab Results  Component Value Date   WBC 8.5 12/21/2020   HGB 14.1 12/21/2020   HCT 41.6 12/21/2020   MCV 90.9 12/21/2020   PLT 307.0 12/21/2020   Lab Results  Component Value Date   TSH 1.78 12/21/2020     Patient Active Problem List   Diagnosis Date Noted   Elevated glucose level 03/29/2019   Screening mammogram, encounter for 03/15/2018   Estrogen deficiency 03/15/2018   Atrophy of muscle of right hand 07/26/2017   Hyperreflexia 07/26/2017   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   Neoplasm of skin 12/28/2012   Osteopenia 12/17/2012   Colon cancer screening 12/17/2012   Hyperlipidemia 12/17/2012   Routine general medical examination at a health care facility 11/04/2011   CARPAL TUNNEL SYNDROME 11/22/2006   Generalized anxiety disorder 11/21/2006   Allergic rhinitis 11/21/2006   ASTHMA 11/21/2006   PSORIASIS, PUSTULAR 11/21/2006   TOBACCO ABUSE 09/13/2006   Past Medical History:  Diagnosis Date   Allergy    Anxiety    Carpal tunnel syndrome    Osteoporosis    Psoriasis    Tobacco abuse    Viral meningitis    Past Surgical History:  Procedure Laterality Date   ABDOMINAL HYSTERECTOMY  12/2004   partial   Arm fracture     Bilateral   CYSTECTOMY  May 2011   Roof of mouth   US TRANSVAGINAL PELVIC MODIFIED  12/2004   Fibroids   Social History   Tobacco Use   Smoking status: Every Day    Packs/day: 1.00    Years: 30.00    Pack years: 30.00    Types: Cigarettes   Smokeless tobacco: Never  Vaping Use   Vaping Use: Never used  Substance Use Topics   Alcohol use: Yes    Alcohol/week: 0.0 standard drinks    Comment: occ.  maybe 3 drinks a month per pt   Drug use: No   Family History  Problem Relation Age of Onset   COPD Mother    Cancer Father        Lung CA, smoker   Thyroid disease Sister    Breast cancer Sister  Colon cancer Neg Hx    Stomach cancer  Neg Hx    Esophageal cancer Neg Hx    Allergies  Allergen Reactions   Amoxicillin Nausea Only   Augmentin [Amoxicillin-Pot Clavulanate] Nausea Only   Pseudoephedrine     REACTION: Heart races at high doses.     Current Outpatient Medications on File Prior to Visit  Medication Sig Dispense Refill   fluticasone (FLONASE) 50 MCG/ACT nasal spray Place 2 sprays into both nostrils daily. (Patient taking differently: Place 2 sprays into both nostrils daily as needed.) 16 g 11   No current facility-administered medications on file prior to visit.     Review of Systems  Constitutional:  Negative for activity change, appetite change, fatigue, fever and unexpected weight change.  HENT:  Negative for congestion, ear pain, rhinorrhea, sinus pressure and sore throat.   Eyes:  Negative for pain, redness and visual disturbance.  Respiratory:  Negative for cough, shortness of breath and wheezing.   Cardiovascular:  Negative for chest pain and palpitations.  Gastrointestinal:  Negative for abdominal pain, blood in stool, constipation and diarrhea.  Endocrine: Negative for polydipsia and polyuria.  Genitourinary:  Negative for dysuria, frequency and urgency.  Musculoskeletal:  Negative for arthralgias, back pain and myalgias.  Skin:  Negative for pallor and rash.       Itching skin on back  Allergic/Immunologic: Negative for environmental allergies.  Neurological:  Negative for dizziness, syncope and headaches.  Hematological:  Negative for adenopathy. Does not bruise/bleed easily.  Psychiatric/Behavioral:  Negative for decreased concentration and dysphoric mood. The patient is not nervous/anxious.        Stressors       Objective:   Physical Exam Constitutional:      General: She is not in acute distress.    Appearance: Normal appearance. She is well-developed and normal weight. She is not ill-appearing or diaphoretic.  HENT:     Head: Normocephalic and atraumatic.     Right Ear: Tympanic  membrane, ear canal and external ear normal.     Left Ear: Tympanic membrane, ear canal and external ear normal.     Nose: Nose normal. No congestion.     Mouth/Throat:     Mouth: Mucous membranes are moist.     Pharynx: Oropharynx is clear. No posterior oropharyngeal erythema.  Eyes:     General: No scleral icterus.    Extraocular Movements: Extraocular movements intact.     Conjunctiva/sclera: Conjunctivae normal.     Pupils: Pupils are equal, round, and reactive to light.  Neck:     Thyroid: No thyromegaly.     Vascular: No carotid bruit or JVD.  Cardiovascular:     Rate and Rhythm: Normal rate and regular rhythm.     Pulses: Normal pulses.     Heart sounds: Normal heart sounds.    No gallop.  Pulmonary:     Effort: Pulmonary effort is normal. No respiratory distress.     Breath sounds: Normal breath sounds. No wheezing.     Comments: Good air exch Chest:     Chest wall: No tenderness.  Abdominal:     General: Bowel sounds are normal. There is no distension or abdominal bruit.     Palpations: Abdomen is soft. There is no mass.     Tenderness: There is no abdominal tenderness.     Hernia: No hernia is present.  Genitourinary:    Comments: Breast exam: No mass, nodules, thickening, tenderness, bulging, retraction, inflamation, nipple discharge or  skin changes noted.  No axillary or clavicular LA.     Musculoskeletal:        General: No tenderness. Normal range of motion.     Cervical back: Normal range of motion and neck supple. No rigidity. No muscular tenderness.     Right lower leg: No edema.     Left lower leg: No edema.     Comments: No kyphosis   Lymphadenopathy:     Cervical: No cervical adenopathy.  Skin:    General: Skin is warm and dry.     Coloration: Skin is not pale.     Findings: No erythema or rash.     Comments: Solar lentigines diffusely   Neurological:     Mental Status: She is alert. Mental status is at baseline.     Cranial Nerves: No cranial nerve  deficit.     Motor: No abnormal muscle tone.     Coordination: Coordination normal.     Gait: Gait normal.     Deep Tendon Reflexes: Reflexes are normal and symmetric. Reflexes normal.  Psychiatric:        Mood and Affect: Mood normal.        Cognition and Memory: Cognition and memory normal.          Assessment & Plan:   Problem List Items Addressed This Visit       Musculoskeletal and Integument   Osteopenia    dexa was June 2020 Ordered f/u scan Pt will schedule at the breast center  No falls or fractures  Not taking supplements-inst to get started on calcium and vitamin D         Other   TOBACCO ABUSE    Disc in detail risks of smoking and possible outcomes including copd, vascular/ heart disease, cancer , respiratory and sinus infections  Pt voices understanding Pt is not ready to quit  Discussed the [facility] cancer screening program [facility]/she has more than 30 pack years  She declined but will let us know if she changes her mind      Routine general medical examination at a health care facility - Primary    Reviewed health habits including diet and exercise and skin cancer prevention Reviewed appropriate screening tests for age  Also reviewed health mt list, fam hx and immunization status , as well as social and family history   Labs reviewed  covid immunized Interested in shingrix if covered  Flu shot given today  Counseled on smoking cessation  Mammogram and colonoscopy are utd  dexa ordered Enc to get started back on ca and D      Relevant Orders   Flu Vaccine QUAD 6+ mos PF IM (Fluarix Quad PF) (Completed)   Hyperlipidemia    Disc goals for lipids and reasons to control them Rev last labs with pt Rev low sat fat diet in detail LDL is up to 145  Per pt diet is bad  Also fam hx CAD I do think we should treat if no improvement with diet  Given handouts  Will re check approx 2 mo If not improved, consider statin /discussed       Estrogen  deficiency   Relevant Orders   DG Bone Density   Elevated glucose level    Lab Results  Component Value Date   HGBA1C 5.9 12/21/2020  Stable disc imp of low glycemic diet and wt loss to prevent DM2       Other Visit Diagnoses  Need for influenza vaccination       Relevant Orders   Flu Vaccine QUAD 6+ mos PF IM (Fluarix Quad PF) (Completed)

## 2020-12-28 NOTE — Assessment & Plan Note (Signed)
Lab Results  Component Value Date   HGBA1C 5.9 12/21/2020   Stable disc imp of low glycemic diet and wt loss to prevent DM2

## 2020-12-28 NOTE — Assessment & Plan Note (Signed)
Disc goals for lipids and reasons to control them Rev last labs with pt Rev low sat fat diet in detail LDL is up to 145  Per pt diet is bad  Also fam hx CAD I do think we should treat if no improvement with diet  Given handouts  Will re check approx 2 mo If not improved, consider statin /discussed

## 2020-12-28 NOTE — Assessment & Plan Note (Signed)
dexa was June 2020 Ordered f/u scan Pt will schedule at the breast center  No falls or fractures  Not taking supplements-inst to get started on calcium and vitamin D

## 2021-05-03 ENCOUNTER — Encounter: Payer: Self-pay | Admitting: Family Medicine

## 2021-05-04 MED ORDER — CETIRIZINE HCL 10 MG PO TABS: 10.0000 mg | ORAL_TABLET | Freq: Every day | ORAL | 1 refills | Status: AC | PRN

## 2021-05-04 MED ORDER — FLUTICASONE PROPIONATE 50 MCG/ACT NA SUSP: 2.0000 | Freq: Every day | NASAL | 11 refills | Status: AC

## 2021-06-18 ENCOUNTER — Ambulatory Visit
Admission: RE | Admit: 2021-06-18 | Discharge: 2021-06-18 | Disposition: A | Discharge status: Home / Self Care | Payer: 59 | Source: Ambulatory Visit | Attending: Family Medicine | Admitting: Family Medicine

## 2021-06-18 ENCOUNTER — Other Ambulatory Visit: Payer: Self-pay | Admitting: Family Medicine

## 2021-06-18 DIAGNOSIS — R921 Mammographic calcification found on diagnostic imaging of breast: Secondary | ICD-10-CM

## 2021-06-18 DIAGNOSIS — E2839 Other primary ovarian failure: Secondary | ICD-10-CM

## 2021-06-23 ENCOUNTER — Encounter: Payer: Self-pay | Admitting: *Deleted

## 2021-12-15 ENCOUNTER — Ambulatory Visit
Admission: RE | Admit: 2021-12-15 | Discharge: 2021-12-15 | Disposition: A | Discharge status: Home / Self Care | Payer: 59 | Source: Ambulatory Visit | Attending: Family Medicine | Admitting: Family Medicine

## 2021-12-15 DIAGNOSIS — R921 Mammographic calcification found on diagnostic imaging of breast: Secondary | ICD-10-CM

## 2023-03-12 IMAGING — MG MM DIGITAL DIAGNOSTIC UNILAT*R* W/ TOMO W/ CAD
6 series · 6 of 14 positions shown · non-contrast
Comparison: Previous exam(s).

CLINICAL DATA: 61-year-old female presenting for six-month
follow-up of probably benign right breast calcifications.

EXAM:
DIGITAL DIAGNOSTIC UNILATERAL RIGHT MAMMOGRAM WITH TOMOSYNTHESIS AND
CAD
TECHNIQUE: Right digital diagnostic mammography and breast tomosynthesis was
performed. The images were evaluated with computer-aided detection.

[R ML]
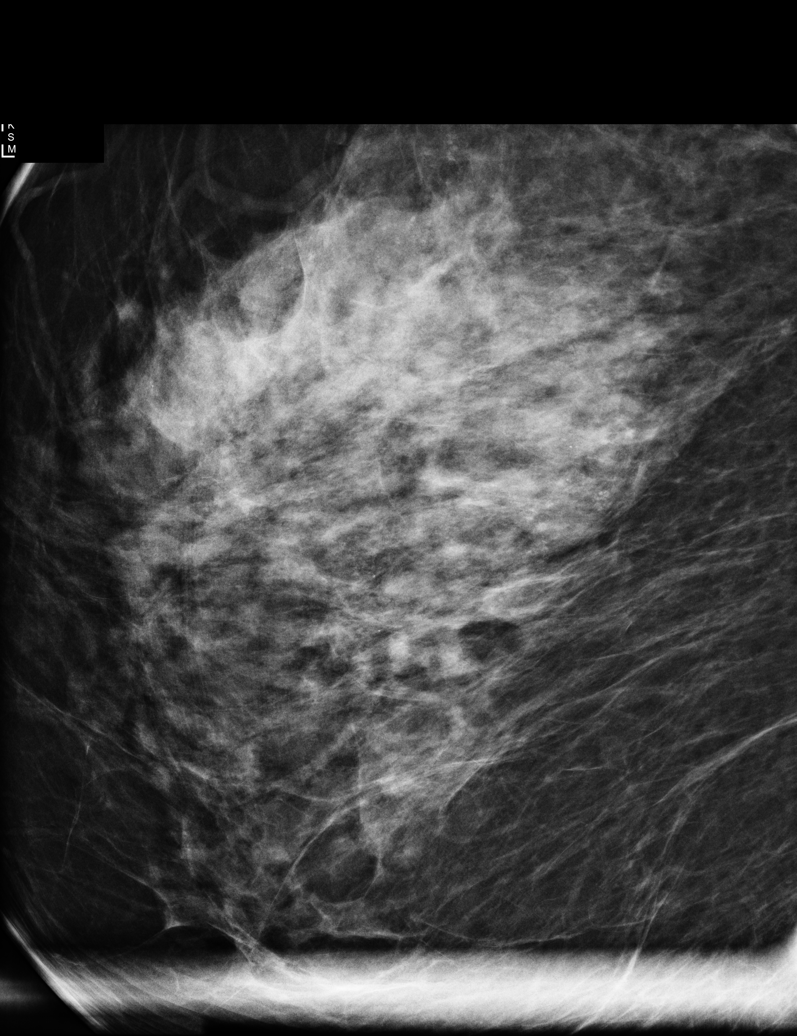

[R CC]
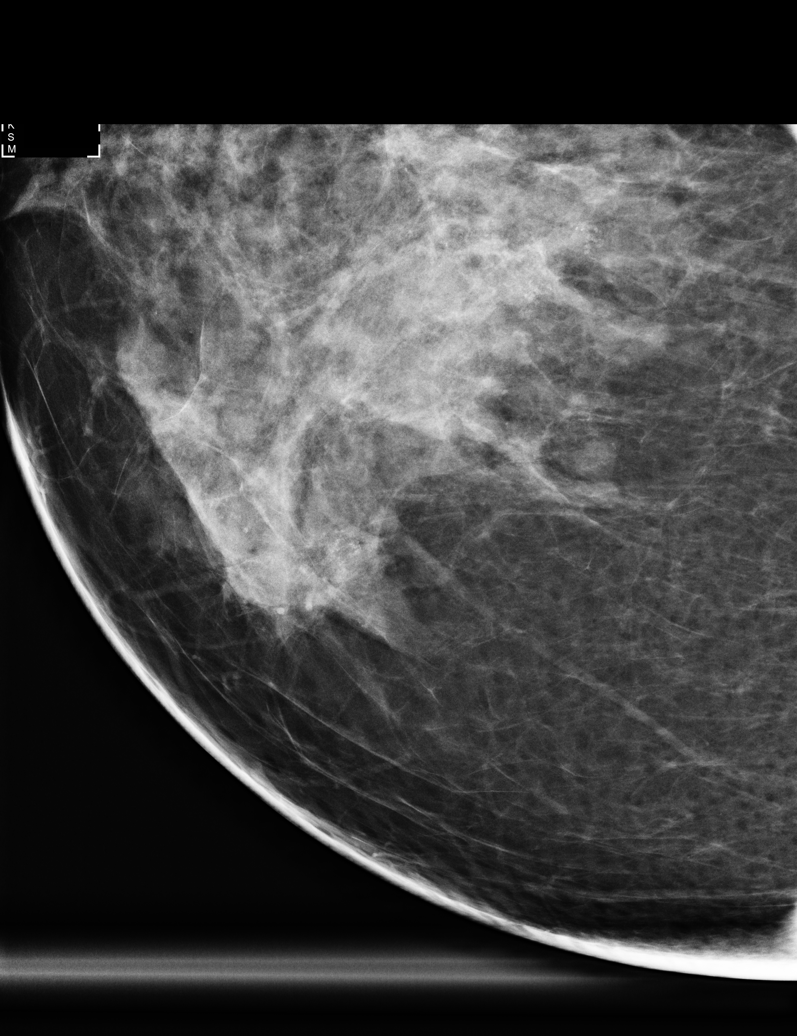

[R CC synth-2D]
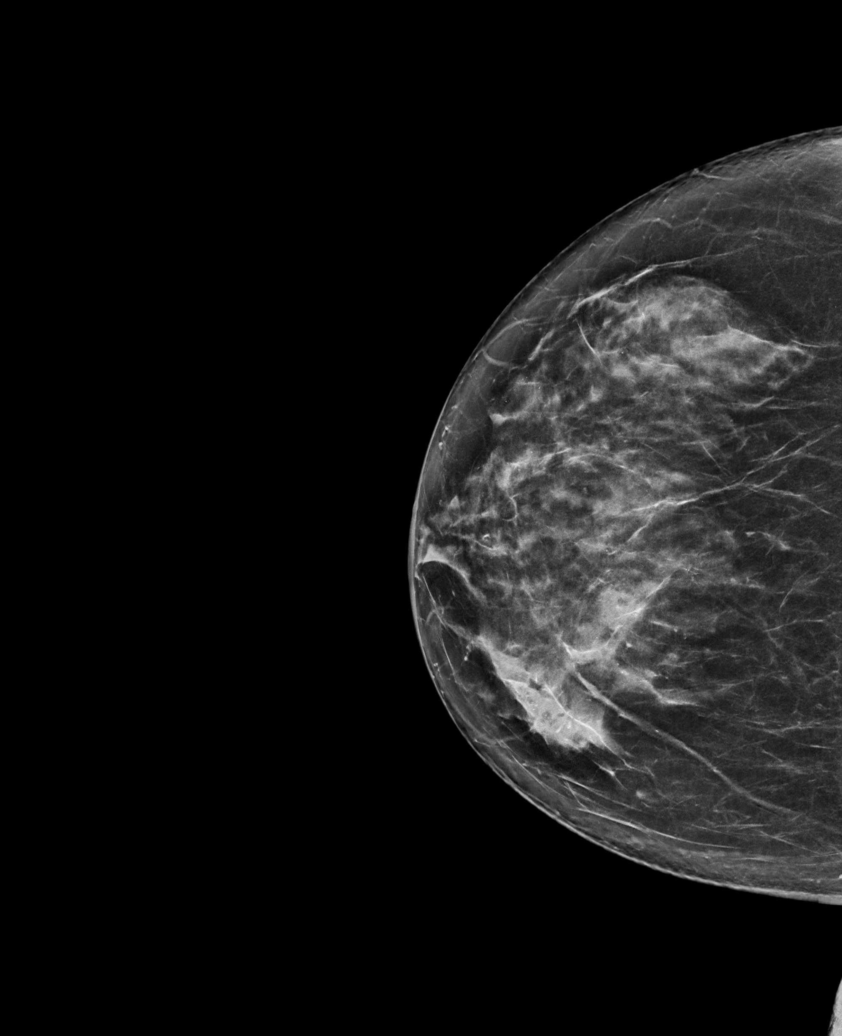

[R MLO synth-2D]
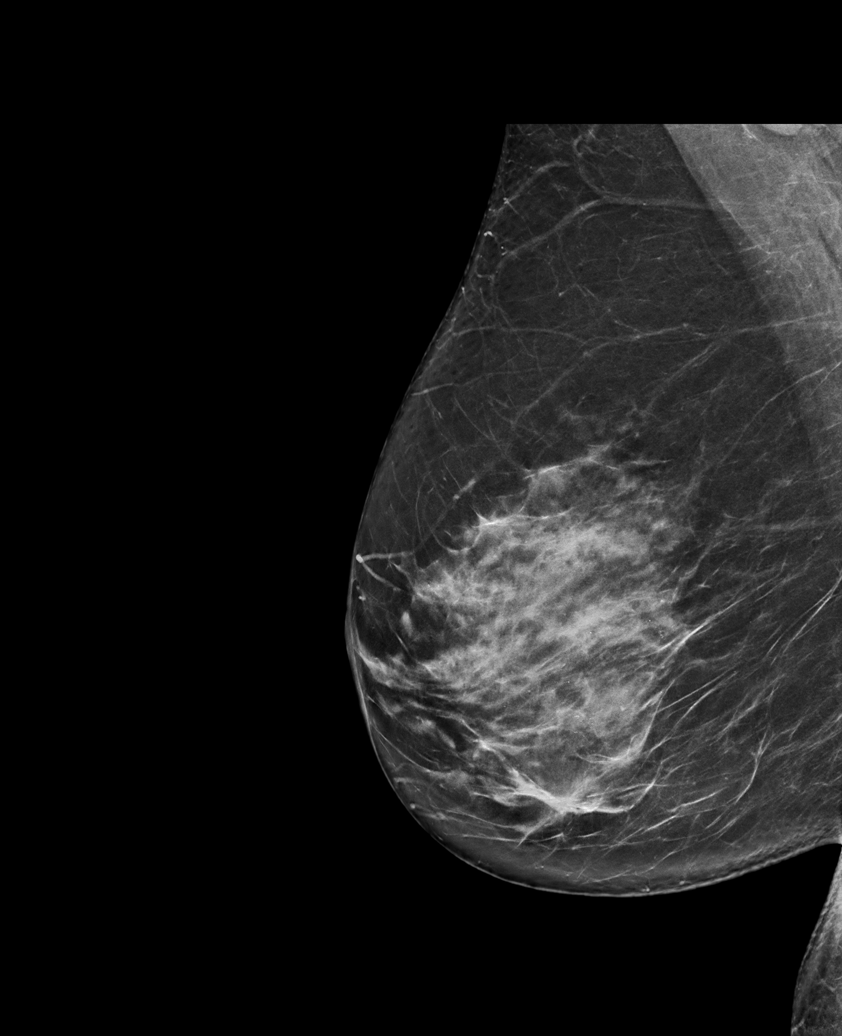

[R MLO tomo · tomo slice 43/86.0]
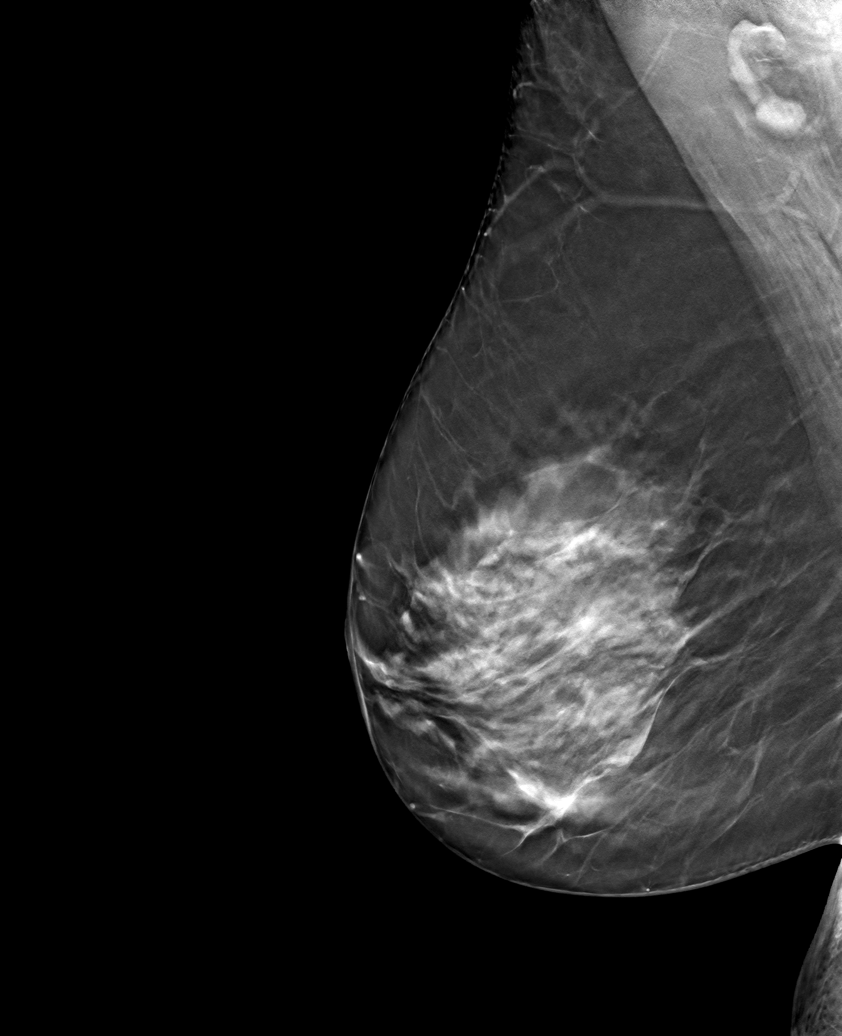

[R CC tomo · tomo slice 41/82.0]
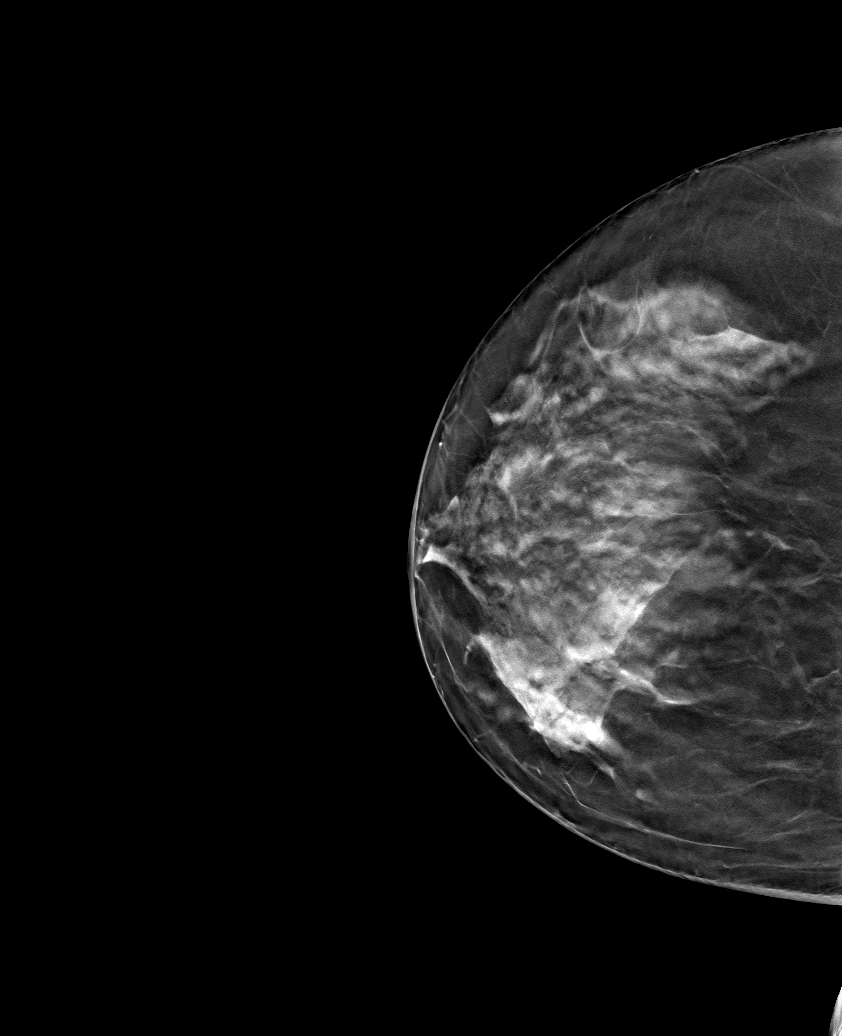

[6 of 14 positions shown; findings below may reference images not displayed]

ACR Breast Density Category c: The breast tissue is heterogeneously
dense, which may obscure small masses.
FINDINGS: Spot 2D magnification views of the right breast were performed in
addition to standard views. There are stable grouped amorphous and
punctate calcifications in the is medial right breast. No new
suspicious linear or branching forms. No new distortion or
associated mass. No new findings elsewhere in the right breast.
IMPRESSION: Stable probably benign right breast calcifications.

RECOMMENDATION:
Diagnostic bilateral mammogram in 6 months.

I have discussed the findings and recommendations with the patient.
If applicable, a reminder letter will be sent to the patient
regarding the next appointment.

BI-RADS CATEGORY  3: Probably benign.
